# Patient Record
Sex: Female | Born: 1970 | Race: White | Hispanic: No | Marital: Married | Smoking: Never smoker
Health system: Southern US, Community
[De-identification: ages and names within clinical notes are randomized; demographics above are authoritative.]

## PROBLEM LIST (undated history)

## (undated) DIAGNOSIS — D259 Leiomyoma of uterus, unspecified: Secondary | ICD-10-CM

## (undated) DIAGNOSIS — L309 Dermatitis, unspecified: Secondary | ICD-10-CM

## (undated) DIAGNOSIS — T7840XA Allergy, unspecified, initial encounter: Secondary | ICD-10-CM

## (undated) DIAGNOSIS — N390 Urinary tract infection, site not specified: Secondary | ICD-10-CM

## (undated) DIAGNOSIS — IMO0001 Reserved for inherently not codable concepts without codable children: Secondary | ICD-10-CM

## (undated) DIAGNOSIS — E039 Hypothyroidism, unspecified: Secondary | ICD-10-CM

## (undated) DIAGNOSIS — B019 Varicella without complication: Secondary | ICD-10-CM

## (undated) DIAGNOSIS — F419 Anxiety disorder, unspecified: Secondary | ICD-10-CM

## (undated) DIAGNOSIS — N200 Calculus of kidney: Secondary | ICD-10-CM

## (undated) HISTORY — DX: Hypothyroidism, unspecified: E03.9

## (undated) HISTORY — DX: Varicella without complication: B01.9

## (undated) HISTORY — PX: INTRAUTERINE DEVICE (IUD) INSERTION: SHX5877

## (undated) HISTORY — DX: Dermatitis, unspecified: L30.9

## (undated) HISTORY — DX: Reserved for inherently not codable concepts without codable children: IMO0001

## (undated) HISTORY — DX: Leiomyoma of uterus, unspecified: D25.9

## (undated) HISTORY — PX: OTHER SURGICAL HISTORY: SHX169

## (undated) HISTORY — DX: Calculus of kidney: N20.0

## (undated) HISTORY — DX: Anxiety disorder, unspecified: F41.9

## (undated) HISTORY — DX: Allergy, unspecified, initial encounter: T78.40XA

## (undated) HISTORY — DX: Urinary tract infection, site not specified: N39.0

## (undated) HISTORY — PX: MYOMECTOMY: SHX85

---

## 1998-03-28 ENCOUNTER — Other Ambulatory Visit: Admission: RE | Admit: 1998-03-28 | Discharge: 1998-03-28 | Payer: Self-pay | Admitting: Obstetrics and Gynecology

## 1998-10-22 ENCOUNTER — Encounter: Admission: RE | Admit: 1998-10-22 | Discharge: 1998-10-22 | Payer: Self-pay | Admitting: Family Medicine

## 1998-12-04 ENCOUNTER — Encounter: Admission: RE | Admit: 1998-12-04 | Discharge: 1998-12-04 | Payer: Self-pay | Admitting: Family Medicine

## 1998-12-08 ENCOUNTER — Encounter: Admission: RE | Admit: 1998-12-08 | Discharge: 1998-12-08 | Payer: Self-pay | Admitting: Family Medicine

## 1999-01-23 ENCOUNTER — Encounter: Admission: RE | Admit: 1999-01-23 | Discharge: 1999-01-23 | Payer: Self-pay | Admitting: Family Medicine

## 1999-01-25 ENCOUNTER — Emergency Department (HOSPITAL_COMMUNITY): Admission: EM | Admit: 1999-01-25 | Discharge: 1999-01-25 | Payer: Self-pay | Admitting: Emergency Medicine

## 1999-02-01 ENCOUNTER — Emergency Department (HOSPITAL_COMMUNITY): Admission: EM | Admit: 1999-02-01 | Discharge: 1999-02-01 | Payer: Self-pay | Admitting: *Deleted

## 1999-04-24 ENCOUNTER — Other Ambulatory Visit: Admission: RE | Admit: 1999-04-24 | Discharge: 1999-04-24 | Payer: Self-pay | Admitting: Gynecology

## 2000-02-01 ENCOUNTER — Other Ambulatory Visit: Admission: RE | Admit: 2000-02-01 | Discharge: 2000-02-01 | Payer: Self-pay | Admitting: Gynecology

## 2000-05-18 ENCOUNTER — Encounter: Admission: RE | Admit: 2000-05-18 | Discharge: 2000-08-03 | Payer: Self-pay | Admitting: Gynecology

## 2000-08-03 ENCOUNTER — Inpatient Hospital Stay (HOSPITAL_COMMUNITY): Admission: AD | Admit: 2000-08-03 | Discharge: 2000-08-05 | Payer: Self-pay | Admitting: Gynecology

## 2000-09-13 ENCOUNTER — Other Ambulatory Visit: Admission: RE | Admit: 2000-09-13 | Discharge: 2000-09-13 | Payer: Self-pay | Admitting: Gynecology

## 2001-09-15 ENCOUNTER — Other Ambulatory Visit: Admission: RE | Admit: 2001-09-15 | Discharge: 2001-09-15 | Payer: Self-pay | Admitting: Gynecology

## 2002-10-19 ENCOUNTER — Other Ambulatory Visit: Admission: RE | Admit: 2002-10-19 | Discharge: 2002-10-19 | Payer: Self-pay | Admitting: Gynecology

## 2003-10-29 ENCOUNTER — Other Ambulatory Visit: Admission: RE | Admit: 2003-10-29 | Discharge: 2003-10-29 | Payer: Self-pay | Admitting: Gynecology

## 2004-11-12 ENCOUNTER — Other Ambulatory Visit: Admission: RE | Admit: 2004-11-12 | Discharge: 2004-11-12 | Payer: Self-pay | Admitting: Gynecology

## 2005-05-11 ENCOUNTER — Encounter: Admission: RE | Admit: 2005-05-11 | Discharge: 2005-05-11 | Payer: Self-pay | Admitting: Gynecology

## 2006-01-06 ENCOUNTER — Other Ambulatory Visit: Admission: RE | Admit: 2006-01-06 | Discharge: 2006-01-06 | Payer: Self-pay | Admitting: Gynecology

## 2008-03-21 ENCOUNTER — Ambulatory Visit: Payer: Self-pay | Admitting: Gynecology

## 2008-03-21 ENCOUNTER — Other Ambulatory Visit: Admission: RE | Admit: 2008-03-21 | Discharge: 2008-03-21 | Payer: Self-pay | Admitting: Gynecology

## 2008-03-21 ENCOUNTER — Encounter: Payer: Self-pay | Admitting: Gynecology

## 2008-04-23 ENCOUNTER — Ambulatory Visit: Payer: Self-pay | Admitting: Gynecology

## 2008-06-25 ENCOUNTER — Ambulatory Visit: Payer: Self-pay | Admitting: Gynecology

## 2008-07-23 ENCOUNTER — Ambulatory Visit: Payer: Self-pay | Admitting: Gynecology

## 2008-09-16 ENCOUNTER — Ambulatory Visit: Payer: Self-pay | Admitting: Gynecology

## 2010-01-22 ENCOUNTER — Other Ambulatory Visit
Admission: RE | Admit: 2010-01-22 | Discharge: 2010-01-22 | Payer: Self-pay | Source: Home / Self Care | Admitting: Gynecology

## 2010-01-22 ENCOUNTER — Ambulatory Visit
Admission: RE | Admit: 2010-01-22 | Discharge: 2010-01-22 | Payer: Self-pay | Source: Home / Self Care | Attending: Gynecology | Admitting: Gynecology

## 2010-01-22 ENCOUNTER — Other Ambulatory Visit: Payer: Self-pay | Admitting: Gynecology

## 2010-01-27 ENCOUNTER — Encounter
Admission: RE | Admit: 2010-01-27 | Discharge: 2010-01-27 | Payer: Self-pay | Source: Home / Self Care | Attending: Gynecology | Admitting: Gynecology

## 2010-07-24 ENCOUNTER — Ambulatory Visit: Payer: Self-pay | Admitting: Gynecology

## 2010-08-27 ENCOUNTER — Other Ambulatory Visit: Payer: Self-pay | Admitting: *Deleted

## 2010-08-27 ENCOUNTER — Encounter: Payer: Self-pay | Admitting: *Deleted

## 2010-08-27 DIAGNOSIS — Z30431 Encounter for routine checking of intrauterine contraceptive device: Secondary | ICD-10-CM

## 2010-08-27 MED ORDER — LEVONORGESTREL 20 MCG/24HR IU IUD: 1.0000 | INTRAUTERINE_SYSTEM | Freq: Once | INTRAUTERINE | Status: DC

## 2010-08-27 NOTE — Progress Notes (Signed)
  Checked benefits for patient for remove/insert IUD per appointments request.  Patient will have no copay at check in.  Informed Claudia in appointments and she will call the patient to schedule.  Order in pc.

## 2010-08-31 ENCOUNTER — Encounter: Payer: Self-pay | Admitting: Anesthesiology

## 2010-09-04 ENCOUNTER — Encounter: Payer: Self-pay | Admitting: Gynecology

## 2010-09-04 ENCOUNTER — Ambulatory Visit (INDEPENDENT_AMBULATORY_CARE_PROVIDER_SITE_OTHER): Payer: BC Managed Care – PPO | Admitting: Gynecology

## 2010-09-04 VITALS — BP 110/60

## 2010-09-04 DIAGNOSIS — IMO0001 Reserved for inherently not codable concepts without codable children: Secondary | ICD-10-CM

## 2010-09-04 DIAGNOSIS — Z30431 Encounter for routine checking of intrauterine contraceptive device: Secondary | ICD-10-CM

## 2010-09-04 HISTORY — DX: Reserved for inherently not codable concepts without codable children: IMO0001

## 2010-09-04 MED ORDER — LEVONORGESTREL 20 MCG/24HR IU IUD: INTRAUTERINE_SYSTEM | Freq: Once | INTRAUTERINE | Status: DC

## 2010-09-04 NOTE — Progress Notes (Signed)
Patient is a 40 year old gravida 2 para 2 who presented to the office today for replacement of her Mirena IUD that was scheduled to remove today after 5 years. Patient requested to reinsert a new one she has done well without any complications with the previous one. Patient has read the literature information risks benefits and pros and cons were discussed. Consent form signed. Patient fully aware that this form of contraception is 99% effective and is good for 5 years.  Pelvic exam: Bartholin urethra Skene glands: Within normal limits Vagina: No gross lesions on inspection Cervix: No lesions or discharge IUD string barely seen Uterus: Anteverted normal size shape and consistency adnexa No palpable masses or tenderness Rectal exam: Deferred  Procedure note: The vagina was cleansed with Betadine solution. A single-tooth tenaculum was placed on the anterior cervical lip. With the use of a [place] clamp the IUD string was grasped and retrieved shown to the patient and discarded. The uterus was then sounded to 7-1/2 cm. The Mirena IUD was inserted in a sterile fashion without any complications. The single-tooth tenaculum was removed, patient was given Aleve for any cramping. Patient scheduled to return back in one month for followup.

## 2010-10-02 ENCOUNTER — Ambulatory Visit (INDEPENDENT_AMBULATORY_CARE_PROVIDER_SITE_OTHER): Payer: BC Managed Care – PPO | Admitting: Gynecology

## 2010-10-02 ENCOUNTER — Encounter: Payer: Self-pay | Admitting: Gynecology

## 2010-10-02 VITALS — BP 120/78

## 2010-10-02 DIAGNOSIS — E039 Hypothyroidism, unspecified: Secondary | ICD-10-CM | POA: Insufficient documentation

## 2010-10-02 DIAGNOSIS — Z975 Presence of (intrauterine) contraceptive device: Secondary | ICD-10-CM

## 2010-10-02 DIAGNOSIS — IMO0001 Reserved for inherently not codable concepts without codable children: Secondary | ICD-10-CM | POA: Insufficient documentation

## 2010-10-02 DIAGNOSIS — Z30431 Encounter for routine checking of intrauterine contraceptive device: Secondary | ICD-10-CM

## 2010-10-02 NOTE — Patient Instructions (Signed)
Will see you in January for your annual exam remember to be fasting that day for blood work.

## 2010-10-02 NOTE — Progress Notes (Signed)
Patient presented to the office today for followup after having placed a Mirena IUD on 09/04/2010. Patient was asymptomatic.  Examination: Pelvic: And urethra Skene was within normal limits Vagina: No gross lesions or discharge Cervix: IUD string not visualized Uterus: Anteverted normal size shape and consistency Rectal exam: Not done  The colposcope was brought into the room and with the use of the endocervical speculum the IUD string was visualized.  Normal pelvic exam one month post Mirena IUD insertion patient scheduled to return January 2013 for her annual gynecological examination or when necessary.

## 2011-01-18 ENCOUNTER — Ambulatory Visit (INDEPENDENT_AMBULATORY_CARE_PROVIDER_SITE_OTHER): Payer: BC Managed Care – PPO

## 2011-01-18 DIAGNOSIS — R21 Rash and other nonspecific skin eruption: Secondary | ICD-10-CM

## 2011-04-12 ENCOUNTER — Other Ambulatory Visit: Payer: Self-pay | Admitting: Gynecology

## 2011-04-12 ENCOUNTER — Telehealth: Payer: Self-pay | Admitting: *Deleted

## 2011-04-12 DIAGNOSIS — Z1231 Encounter for screening mammogram for malignant neoplasm of breast: Secondary | ICD-10-CM

## 2011-04-12 MED ORDER — LEVOTHYROXINE SODIUM 50 MCG PO TABS: 50.0000 ug | ORAL_TABLET | Freq: Every day | ORAL | Status: DC

## 2011-04-12 NOTE — Telephone Encounter (Signed)
Pt called requesting refill on synthroid 50 mcg. Pt has annual scheduled 04/23/11. rx sent to pharmacy or 30 day supply only.

## 2011-04-14 ENCOUNTER — Ambulatory Visit
Admission: RE | Admit: 2011-04-14 | Discharge: 2011-04-14 | Disposition: A | Discharge status: Home / Self Care | Payer: BC Managed Care – PPO | Source: Ambulatory Visit | Attending: Gynecology | Admitting: Gynecology

## 2011-04-14 DIAGNOSIS — Z1231 Encounter for screening mammogram for malignant neoplasm of breast: Secondary | ICD-10-CM

## 2011-04-23 ENCOUNTER — Ambulatory Visit (INDEPENDENT_AMBULATORY_CARE_PROVIDER_SITE_OTHER): Payer: BC Managed Care – PPO | Admitting: Gynecology

## 2011-04-23 ENCOUNTER — Encounter: Payer: Self-pay | Admitting: Gynecology

## 2011-04-23 VITALS — BP 110/76 | Ht 63.0 in | Wt 135.0 lb

## 2011-04-23 DIAGNOSIS — E039 Hypothyroidism, unspecified: Secondary | ICD-10-CM

## 2011-04-23 DIAGNOSIS — Z01419 Encounter for gynecological examination (general) (routine) without abnormal findings: Secondary | ICD-10-CM

## 2011-04-23 DIAGNOSIS — R634 Abnormal weight loss: Secondary | ICD-10-CM

## 2011-04-23 DIAGNOSIS — L309 Dermatitis, unspecified: Secondary | ICD-10-CM | POA: Insufficient documentation

## 2011-04-23 DIAGNOSIS — Z833 Family history of diabetes mellitus: Secondary | ICD-10-CM

## 2011-04-23 LAB — GLUCOSE, RANDOM: Glucose, Bld: 84 mg/dL (ref 70–99)

## 2011-04-23 LAB — LIPID PANEL
Cholesterol: 150 mg/dL (ref 0–200)
HDL: 47 mg/dL (ref >39)
LDL Cholesterol: 89 mg/dL (ref 0–99)
Total CHOL/HDL Ratio: 3.2 Ratio
Triglycerides: 72 mg/dL (ref <150)
VLDL: 14 mg/dL (ref 0–40)

## 2011-04-23 MED ORDER — LEVOTHYROXINE SODIUM 50 MCG PO TABS: 50.0000 ug | ORAL_TABLET | Freq: Every day | ORAL | Status: DC

## 2011-04-23 NOTE — Patient Instructions (Signed)

## 2011-04-23 NOTE — Progress Notes (Signed)
Julia Wagner 07-25-1970 [id number]   History:    41 y.o.  for annual exam with the only complaint that she has was shaky hands sporadically at different times. She had a Mirena IUD changed and a new one placed the last year and is having normal cycles otherwise. She has been exercising regularly and dilating to the point that she was weighing 177 pounds and is down to 135 now with a BMI of 23.91. She in frequently does her self breast examinations had a normal mammogram this year. She does have a history of hypothyroidism currently on levothyroxine 50 mcg daily.  Past medical history,surgical history, family history and social history were all reviewed and documented in the EPIC chart.  Gynecologic History Patient's last menstrual period was 03/19/2011. Contraception: IUD Last Pap: 2012. Results were: normal Last mammogram: 2013. Results were: normal  Obstetric History OB History    Grav Para Term Preterm Abortions TAB SAB Ect Mult Living   2 2        2      # Outc Date GA Lbr Len/2nd Wgt Sex Del Anes PTL Lv   1 PAR            2 PAR                ROS:  Was performed and pertinent positives and negatives are included in the history.  Exam: chaperone present  BP 110/76  Ht 5\' 3"  (1.6 m)  Wt 135 lb (61.236 kg)  BMI 23.91 kg/m2  LMP 03/19/2011  Body mass index is 23.91 kg/(m^2).  General appearance : Well developed well nourished female. No acute distress HEENT: Neck supple, trachea midline, no carotid bruits, no thyroidmegaly Lungs: Clear to auscultation, no rhonchi or wheezes, or rib retractions  Heart: Regular rate and rhythm, no murmurs or gallops Breast:Examined in sitting and supine position were symmetrical in appearance, no palpable masses or tenderness,  no skin retraction, no nipple inversion, no nipple discharge, no skin discoloration, no axillary or supraclavicular lymphadenopathy Abdomen: no palpable masses or tenderness, no rebound or guarding Extremities: no  edema or skin discoloration or tenderness  Pelvic:  Bartholin, Urethra, Skene Glands: Within normal limits, IUD string seen             Vagina: No gross lesions or discharge  Cervix: No gross lesions or discharge  Uterus  anteverted, normal size, shape and consistency, non-tender and mobile  Adnexa  Without masses or tenderness  Anus and perineum  normal   Rectovaginal  normal sphincter tone without palpated masses or tenderness             Hemoccult not done     Assessment/Plan:  41 y.o. female for annual exam with history of hypothyroidism on 50 mcg of levothyroxine. We'll check a TSH today along with a fasting blood sugar fasting lipid profile CBC and urinalysis. Literature information was provided on self breast examination. New Pap smear screening guidelines discussed. She's 89 a Pap smear for 2 more years. She will be referred to a neurologist for further evaluation of her shaky hands.    Ok Edwards MD, 8:58 AM 04/23/2011

## 2011-04-24 LAB — CBC WITH DIFFERENTIAL/PLATELET
Basophils Absolute: 0.1 10*3/uL (ref 0.0–0.1)
Basophils Relative: 1 % (ref 0–1)
Eosinophils Absolute: 0.3 10*3/uL (ref 0.0–0.7)
Eosinophils Relative: 4 % (ref 0–5)
HCT: 40.6 % (ref 36.0–46.0)
Hemoglobin: 13.2 g/dL (ref 12.0–15.0)
Lymphocytes Relative: 37 % (ref 12–46)
Lymphs Abs: 2.5 10*3/uL (ref 0.7–4.0)
MCH: 30.8 pg (ref 26.0–34.0)
MCHC: 32.5 g/dL (ref 30.0–36.0)
MCV: 94.9 fL (ref 78.0–100.0)
Monocytes Absolute: 0.5 10*3/uL (ref 0.1–1.0)
Monocytes Relative: 8 % (ref 3–12)
Neutro Abs: 3.4 10*3/uL (ref 1.7–7.7)
Neutrophils Relative %: 51 % (ref 43–77)
Platelets: 278 10*3/uL (ref 150–400)
RBC: 4.28 MIL/uL (ref 3.87–5.11)
RDW: 13.2 % (ref 11.5–15.5)
WBC: 6.7 10*3/uL (ref 4.0–10.5)

## 2011-04-24 LAB — URINALYSIS W MICROSCOPIC + REFLEX CULTURE
Bilirubin Urine: NEGATIVE
Casts: NONE SEEN
Crystals: NONE SEEN
Glucose, UA: NEGATIVE mg/dL
Ketones, ur: NEGATIVE mg/dL
Nitrite: NEGATIVE
Protein, ur: NEGATIVE mg/dL
Specific Gravity, Urine: 1.014 (ref 1.005–1.030)
Urobilinogen, UA: 0.2 mg/dL (ref 0.0–1.0)
pH: 7 (ref 5.0–8.0)

## 2011-04-24 LAB — TSH: TSH: 4.313 u[IU]/mL (ref 0.350–4.500)

## 2011-04-27 ENCOUNTER — Other Ambulatory Visit: Payer: Self-pay | Admitting: Gynecology

## 2011-04-27 LAB — URINE CULTURE: Colony Count: 70000

## 2011-04-27 MED ORDER — NITROFURANTOIN MONOHYD MACRO 100 MG PO CAPS: 100.0000 mg | ORAL_CAPSULE | Freq: Two times a day (BID) | ORAL | Status: AC

## 2011-05-12 ENCOUNTER — Other Ambulatory Visit: Payer: Self-pay | Admitting: *Deleted

## 2011-05-12 DIAGNOSIS — E039 Hypothyroidism, unspecified: Secondary | ICD-10-CM

## 2011-05-12 MED ORDER — LEVOTHYROXINE SODIUM 50 MCG PO TABS: 50.0000 ug | ORAL_TABLET | Freq: Every day | ORAL | Status: DC

## 2011-10-26 ENCOUNTER — Ambulatory Visit (INDEPENDENT_AMBULATORY_CARE_PROVIDER_SITE_OTHER): Payer: BC Managed Care – PPO | Admitting: Internal Medicine

## 2011-10-26 ENCOUNTER — Ambulatory Visit: Payer: BC Managed Care – PPO

## 2011-10-26 VITALS — BP 105/70 | HR 76 | Temp 98.1°F | Resp 18 | Wt 136.0 lb

## 2011-10-26 DIAGNOSIS — M542 Cervicalgia: Secondary | ICD-10-CM

## 2011-10-26 NOTE — Progress Notes (Deleted)
  Subjective:    Patient ID: Julia Wagner, female    DOB: Jun 30, 1970, 41 y.o.   MRN: [id number]  HPI    Review of Systems     Objective:   Physical Exam        Assessment & Plan:
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
## 2011-11-02 ENCOUNTER — Ambulatory Visit (INDEPENDENT_AMBULATORY_CARE_PROVIDER_SITE_OTHER): Payer: BC Managed Care – PPO | Admitting: Physician Assistant

## 2011-11-02 ENCOUNTER — Telehealth: Payer: Self-pay

## 2011-11-02 VITALS — BP 117/79 | HR 64 | Temp 98.0°F | Resp 16 | Ht 63.0 in | Wt 137.2 lb

## 2011-11-02 DIAGNOSIS — M542 Cervicalgia: Secondary | ICD-10-CM

## 2011-11-02 DIAGNOSIS — M62838 Other muscle spasm: Secondary | ICD-10-CM

## 2011-11-02 MED ORDER — DICLOFENAC SODIUM 75 MG PO TBEC: 75.0000 mg | DELAYED_RELEASE_TABLET | Freq: Two times a day (BID) | ORAL | Status: DC

## 2011-11-02 MED ORDER — CYCLOBENZAPRINE HCL 5 MG PO TABS: 5.0000 mg | ORAL_TABLET | Freq: Three times a day (TID) | ORAL | Status: DC | PRN

## 2011-11-02 NOTE — Telephone Encounter (Signed)
Spoke with pt and obtained more information: states her neck still feels tired from holding head up.  Right side are and upper back has sharp  Pain.  Right axillary area comes and goes (sharp pain).  Now is experiencing left side jaw tenderness, and noticed last night light bruise on right side of back.  She has been taking aleve and resting as instructed.  Wants to know what she needs to do, come in? PT? Please advise.

## 2011-11-02 NOTE — Telephone Encounter (Signed)
Patient is still having issues and was wondering if she needs to come in or if she should be referred to a PT.  Best [phone]

## 2011-11-02 NOTE — Telephone Encounter (Signed)
Best number is [phone], but after 5 can call cell [phone]

## 2011-11-02 NOTE — Telephone Encounter (Signed)
Called pt to advise
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
## 2011-11-02 NOTE — Telephone Encounter (Signed)
I think she may need to RTC for further evaluation, especially since she is having new symptoms.

## 2011-11-10 ENCOUNTER — Ambulatory Visit: Payer: BC Managed Care – PPO | Attending: Physician Assistant

## 2011-11-10 DIAGNOSIS — M546 Pain in thoracic spine: Secondary | ICD-10-CM | POA: Insufficient documentation

## 2011-11-10 DIAGNOSIS — R5381 Other malaise: Secondary | ICD-10-CM | POA: Insufficient documentation

## 2011-11-10 DIAGNOSIS — M542 Cervicalgia: Secondary | ICD-10-CM | POA: Insufficient documentation

## 2011-11-10 DIAGNOSIS — IMO0001 Reserved for inherently not codable concepts without codable children: Secondary | ICD-10-CM | POA: Insufficient documentation

## 2011-11-12 ENCOUNTER — Ambulatory Visit: Payer: BC Managed Care – PPO | Admitting: Physical Therapy

## 2011-11-16 ENCOUNTER — Ambulatory Visit: Payer: BC Managed Care – PPO | Admitting: Physical Therapy

## 2011-11-22 ENCOUNTER — Ambulatory Visit: Payer: BC Managed Care – PPO

## 2011-11-24 ENCOUNTER — Ambulatory Visit: Payer: BC Managed Care – PPO | Admitting: Physical Therapy

## 2011-11-29 ENCOUNTER — Ambulatory Visit: Payer: BC Managed Care – PPO

## 2011-12-01 ENCOUNTER — Telehealth: Payer: Self-pay | Admitting: *Deleted

## 2011-12-01 NOTE — Telephone Encounter (Signed)
Pt called c/o possible UTI, requesting medication. I told pt OV best, pt said she has a hockey game this evening and will call back later. I told pt we are closed for thanksgiving holiday on thurs & fri and urgent would be able to help with treatment if needed. Pt said okay.

## 2011-12-06 ENCOUNTER — Ambulatory Visit: Payer: BC Managed Care – PPO | Attending: Physician Assistant | Admitting: Physical Therapy

## 2011-12-06 DIAGNOSIS — M542 Cervicalgia: Secondary | ICD-10-CM | POA: Diagnosis not present

## 2011-12-06 DIAGNOSIS — IMO0001 Reserved for inherently not codable concepts without codable children: Secondary | ICD-10-CM | POA: Insufficient documentation

## 2011-12-06 DIAGNOSIS — M546 Pain in thoracic spine: Secondary | ICD-10-CM | POA: Insufficient documentation

## 2011-12-06 DIAGNOSIS — R5381 Other malaise: Secondary | ICD-10-CM | POA: Insufficient documentation

## 2011-12-08 ENCOUNTER — Ambulatory Visit: Payer: BC Managed Care – PPO

## 2011-12-08 ENCOUNTER — Ambulatory Visit: Payer: BC Managed Care – PPO | Admitting: Physical Therapy

## 2011-12-13 ENCOUNTER — Encounter: Payer: BC Managed Care – PPO | Admitting: Physical Therapy

## 2011-12-15 ENCOUNTER — Encounter: Payer: BC Managed Care – PPO | Admitting: Physical Therapy

## 2012-04-05 ENCOUNTER — Ambulatory Visit (INDEPENDENT_AMBULATORY_CARE_PROVIDER_SITE_OTHER): Payer: BC Managed Care – PPO | Admitting: Gynecology

## 2012-04-05 ENCOUNTER — Encounter: Payer: Self-pay | Admitting: Gynecology

## 2012-04-05 VITALS — BP 110/70

## 2012-04-05 DIAGNOSIS — R350 Frequency of micturition: Secondary | ICD-10-CM

## 2012-04-05 DIAGNOSIS — N938 Other specified abnormal uterine and vaginal bleeding: Secondary | ICD-10-CM

## 2012-04-05 DIAGNOSIS — M545 Low back pain: Secondary | ICD-10-CM

## 2012-04-05 DIAGNOSIS — N949 Unspecified condition associated with female genital organs and menstrual cycle: Secondary | ICD-10-CM

## 2012-04-05 DIAGNOSIS — J069 Acute upper respiratory infection, unspecified: Secondary | ICD-10-CM

## 2012-04-05 DIAGNOSIS — IMO0001 Reserved for inherently not codable concepts without codable children: Secondary | ICD-10-CM

## 2012-04-05 DIAGNOSIS — M549 Dorsalgia, unspecified: Secondary | ICD-10-CM

## 2012-04-05 DIAGNOSIS — B373 Candidiasis of vulva and vagina: Secondary | ICD-10-CM

## 2012-04-05 DIAGNOSIS — N898 Other specified noninflammatory disorders of vagina: Secondary | ICD-10-CM

## 2012-04-05 LAB — URINALYSIS W MICROSCOPIC + REFLEX CULTURE
Bilirubin Urine: NEGATIVE
Casts: NONE SEEN
Crystals: NONE SEEN
Glucose, UA: NEGATIVE mg/dL
Ketones, ur: NEGATIVE mg/dL
Nitrite: NEGATIVE
Protein, ur: NEGATIVE mg/dL
Specific Gravity, Urine: 1.01 (ref 1.005–1.030)
Urobilinogen, UA: 0.2 mg/dL (ref 0.0–1.0)
pH: 5.5 (ref 5.0–8.0)

## 2012-04-05 LAB — WET PREP FOR TRICH, YEAST, CLUE
Clue Cells Wet Prep HPF POC: NONE SEEN
Trich, Wet Prep: NONE SEEN

## 2012-04-05 MED ORDER — FLUCONAZOLE 100 MG PO TABS: ORAL_TABLET | ORAL | Status: DC

## 2012-04-05 NOTE — Patient Instructions (Signed)
Vaginitis Vaginitis in a soreness, swelling and redness (inflammation) of the vagina and vulva. This is not a sexually transmitted infection.  CAUSES  Yeast vaginitis is caused by yeast (candida) that is normally found in your vagina. With a yeast infection, the candida has over grown in number to a point that upsets the chemical balance. SYMPTOMS   White thick vaginal discharge.   Swelling, itching, redness and irritation of the vagina and possibly the lips of the vagina (vulva).   Burning or painful urination.   Painful intercourse.  HOME CARE INSTRUCTIONS   Finish all medication as prescribed.   Do not have sex until treatment is completed or instructed by your healthcare giver.   Take warm sitz baths.   Do not douche.   Do not use tampons, especially scented ones.   Wear cotton underwear.   Avoid tight pants and panty hose.   Tell your sexual partner that you have a yeast infection. They should go to their caregiver if they have symptoms such as mild rash or itching.   Your sexual partner should be treated if your infection is difficult to eliminate.   Practice safer sex. Use condoms.   Some vaginal medications cause latex condoms to fail. Ask your caregiver this.  SEEK MEDICAL CARE IF:   You develop a fever.   The infection is getting worse after 2 days of treatment.   The infection is not getting better after 3 days of treatment.   You develop blisters in or around your vagina.   You develop vaginal bleeding, and it is not your menstrual period.   You have pain when you urinate.   You develop intestinal problems.   You have pain with sexual intercourse.  Document Released: 01/29/2004 Document Revised: 12/10/2010 Document Reviewed: 09/05/2008 [facility] Patient Information 2012 [place], [place].  Transvaginal Ultrasound Transvaginal ultrasound is a pelvic ultrasound, using a metal probe that is placed in the vagina, to look at a women's female organs.  Transvaginal ultrasound is a method of seeing inside the pelvis of a woman. The ultrasound machine sends out sound waves from the transducer (probe). These sound waves bounce off body structures (like an echo) to create a picture. The picture shows up on a monitor. It is called transvaginal because the probe is inserted into the vagina. There should be very little discomfort from the vaginal probe. This test can also be used during pregnancy. Endovaginal ultrasound is another name for a transvaginal ultrasound. In a transabdominal ultrasound, the probe is placed on the outside of the belly. This method gives pictures that are lower quality than pictures from the transvaginal technique. Transvaginal ultrasound is used to look for problems of the female genital tract. Some such problems include:  Infertility problems.  Congenital (birth defect) malformations of the uterus and ovaries.  Tumors in the uterus.  Abnormal bleeding.  Ovarian tumors and cysts.  Abscess (inflamed tissue around pus) in the pelvis.  Unexplained abdominal or pelvic pain.  Pelvic infection. DURING PREGNANCY, TRANSVAGINAL ULTRASOUND MAY BE USED TO LOOK AT:  Normal pregnancy.  Ectopic pregnancy (pregnancy outside the uterus).  Fetal heartbeat.  Abnormalities in the pelvis, that are not seen well with transabdominal ultrasound.  Suspected twins or multiples.  Impending miscarriage.  Problems with the cervix (incompetent cervix, not able to stay closed and hold the baby).  When doing an amniocentesis (removing fluid from the pregnancy sac, for testing).  Looking for abnormalities of the baby.  Checking the growth, development, and age  of the fetus.  Measuring the amount of fluid in the amniotic sac.  When doing an external version of the baby (moving baby into correct position).  Evaluating the baby for problems in high risk pregnancies (biophysical profile).  Suspected fetal demise (death). Sometimes a  special ultrasound method called Saline Infusion Sonography (SIS) is used for a more accurate look at the uterus. Sterile saline (salt water) is injected into the uterus of non-pregnant patients to see the inside of the uterus better. SIS is not used on pregnant women. The vaginal probe can also assist in obtaining biopsies of abnormal areas, in draining fluid from cysts on the ovary, and in finding IUDs (intrauterine device, birth control) that cannot be located. PREPARATION FOR TEST A transvaginal ultrasound is done with the bladder empty. The transabdominal ultrasound is done with your bladder full. You may be asked to drink several glasses of water before that exam. Sometimes, a transabdominal ultrasound is done just after a transvaginal ultrasound, to look at organs in your abdomen. PROCEDURE  You will lie down on a table, with your knees bent and your feet in foot holders. The probe is covered with a condom. A sterile lubricant is put into the vagina and on the probe. The lubricant helps transmit the sound waves and avoid irritating the vagina. Your caregiver will move the probe inside the vaginal cavity to scan the pelvic structures. A normal test will show a normal pelvis and normal contents. An abnormal test will show abnormalities of the pelvis, placenta, or baby. ABNORMAL RESULTS MAY BE DUE TO:  Growths or tumors in the:  Uterus.  Ovaries.  Vagina.  Other pelvic structures.  Non-cancerous growths of the uterus and ovaries.  Twisting of the ovary, cutting off blood supply to the ovary (ovarian torsion).  Areas of infection, including:  Pelvic inflammatory disease.  Abscess in the pelvis.  Locating an IUD. PROBLEMS FOUND IN PREGNANT WOMEN MAY INCLUDE:  Ectopic pregnancy (pregnancy outside the uterus).  Multiple pregnancies.  Early dilation (opening) of the cervix. This may indicate an incompetent cervix and early delivery.  Impending miscarriage.  Fetal  death.  Problems with the placenta, including:  Placenta has grown over the opening of the womb (placenta previa).  Placenta has separated early in the womb (placental abruption).  Placenta grows into the muscle of the uterus (placenta accreta).  Tumors of pregnancy, including gestational trophoblastic disease. This is an abnormal pregnancy, with no fetus. The uterus is filled with many grape-like cysts that could sometimes be cancerous.  Incorrect position of the fetus (breech, vertex).  Intrauterine fetal growth retardation (IUGR) (poor growth in the womb).  Fetal abnormalities or infection. RISKS AND COMPLICATIONS There are no known risks to the ultrasound procedure. There is no X-ray used when doing an ultrasound. Document Released: 12/03/2003 Document Revised: 03/15/2011 Document Reviewed: 11/20/2008 [facility] Patient Information 2013 [place], [place].

## 2012-04-05 NOTE — Progress Notes (Signed)
Patient presented to the office today with several complaints. One other concerns she was having low back discomfort which started a week ago. She was having some frequency but no dysuria but she attributes to her increase fluid intake. She denied any fever chills nausea vomiting but was complaining of a slight vaginal discharge. She stated time she felt some pressure when voiding. She stated she had some amoxicillin at home and she took 250 mg twice a day for 2 days and has 2 tablets left. She had taking it because she thought she had some sinus infection but feels better today. She also brought to my attention that since the end of last year she is having breakthrough bleeding with a Mirena IUD.  Exam: Lungs clear to auscultation Rogers or wheezes Heart: Regular rate and rhythm no murmurs or gallops Oropharyngeal no lesions Otoscopic exam no lesions Neck no submandibular lymphadenopathy No frontal or maxillary tender sinuses Back exam: No CVA tenderness slight para spinous tenderness on the lower lumbar region (left) Abdomen: Soft nontender no rebound or guarding Pelvic: Bartholin urethra Skene was within normal limits Vagina: White discharge was noted Cervix: IUD string not seen Uterus: Anteverted upper limits of normal no palpable masses or tenderness Adnexa: No palpable masses or tenderness Rectal exam: Not done  Urinalysis: 3-6 WBC, few bacteria  Wet prep moderate yeast  Assessment/plan: #1 vulvovaginitis will be treated with Diflucan 100 mg one by mouth today. She will finish the last 2 tablets of amoxicillin that she has. We'll check a urine culture and wait for the results. She will return back to the office next week for an ultrasound to confirm that the IUD still in the right place. We'll discuss then managing her breakthrough bleeding on the IUD after the ultrasound. Her back discomfort as far in between and baby from exercising she can take nonsteroidal when necessary

## 2012-04-07 LAB — URINE CULTURE
Colony Count: NO GROWTH
Organism ID, Bacteria: NO GROWTH

## 2012-04-14 ENCOUNTER — Ambulatory Visit (INDEPENDENT_AMBULATORY_CARE_PROVIDER_SITE_OTHER): Payer: BC Managed Care – PPO

## 2012-04-14 ENCOUNTER — Encounter: Payer: Self-pay | Admitting: Gynecology

## 2012-04-14 ENCOUNTER — Ambulatory Visit (INDEPENDENT_AMBULATORY_CARE_PROVIDER_SITE_OTHER): Payer: BC Managed Care – PPO | Admitting: Gynecology

## 2012-04-14 DIAGNOSIS — N938 Other specified abnormal uterine and vaginal bleeding: Secondary | ICD-10-CM

## 2012-04-14 DIAGNOSIS — N83209 Unspecified ovarian cyst, unspecified side: Secondary | ICD-10-CM

## 2012-04-14 DIAGNOSIS — D250 Submucous leiomyoma of uterus: Secondary | ICD-10-CM | POA: Insufficient documentation

## 2012-04-14 DIAGNOSIS — N949 Unspecified condition associated with female genital organs and menstrual cycle: Secondary | ICD-10-CM

## 2012-04-14 DIAGNOSIS — M549 Dorsalgia, unspecified: Secondary | ICD-10-CM

## 2012-04-14 DIAGNOSIS — N83202 Unspecified ovarian cyst, left side: Secondary | ICD-10-CM

## 2012-04-14 MED ORDER — MEGESTROL ACETATE 40 MG PO TABS: 40.0000 mg | ORAL_TABLET | Freq: Two times a day (BID) | ORAL | Status: DC

## 2012-04-14 NOTE — Progress Notes (Signed)
Patient presented to the office today for an ultrasound due to the fact that she been complaining of low back discomfort and regular bleeding on the Mirena IUD since the end of last year. She was seen in the office on 04/05/2012 see previous note for additional details.  Ultrasound today: Uterus 10.6 x 6.2 x 4.6 and meters with endometrial stripe of 5.1 mm. An endometrial fibroid measuring 3.3 x 2.5 x 2.9 cm was seen. The IUD was seen transversely on top of the fibroid in the fundus. Right ovary was normal. A left ovarian echo-free thinwall avascular cyst measuring 3.5 x 3.4 x 2.3 cm was noted. No free fluid in the cul-de-sac.  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 She will make an appointment to return back to the office in one to 2 weeks for sonohysterogram to better delineate the submucous myoma and plan at a later date for resectoscopic myomectomy as an outpatient procedure.  The left ovarian cyst appears to be benign and we'll continue to follow Paps with another scan 3-4 months from now.

## 2012-04-14 NOTE — Patient Instructions (Addendum)
Fibroids Fibroids are lumps (tumors) that can occur any place in a woman's body. These lumps are not cancerous. Fibroids vary in size, weight, and where they grow. HOME CARE  Do not take aspirin.  Write down the number of pads or tampons you use during your period. Tell your doctor. This can help determine the best treatment for you. GET HELP RIGHT AWAY IF:  You have pain in your lower belly (abdomen) that is not helped with medicine.  You have cramps that are not helped with medicine.  You have more bleeding between or during your period.  You feel lightheaded or pass out (faint).  Your lower belly pain gets worse. MAKE SURE YOU:  Understand these instructions.  Will watch your condition.  Will get help right away if you are not doing well or get worse. Document Released: 01/23/2010 Document Revised: 03/15/2011 Document Reviewed: 01/23/2010 [employer] Patient Information 2013 [employer].  

## 2012-05-01 ENCOUNTER — Telehealth: Payer: Self-pay | Admitting: *Deleted

## 2012-05-01 NOTE — Telephone Encounter (Signed)
Pt is scheduled for [facility] on 05/03/12 pt asked if okay to take megace 40 mg given on OV 04/14/12 when bleeding should occur, I informed pt this medication will help stop bleeding. She will refill do to light spotting.

## 2012-05-03 ENCOUNTER — Ambulatory Visit (INDEPENDENT_AMBULATORY_CARE_PROVIDER_SITE_OTHER): Payer: BC Managed Care – PPO

## 2012-05-03 ENCOUNTER — Ambulatory Visit (INDEPENDENT_AMBULATORY_CARE_PROVIDER_SITE_OTHER): Payer: BC Managed Care – PPO | Admitting: Gynecology

## 2012-05-03 DIAGNOSIS — Z975 Presence of (intrauterine) contraceptive device: Secondary | ICD-10-CM

## 2012-05-03 DIAGNOSIS — D259 Leiomyoma of uterus, unspecified: Secondary | ICD-10-CM

## 2012-05-03 DIAGNOSIS — N938 Other specified abnormal uterine and vaginal bleeding: Secondary | ICD-10-CM

## 2012-05-03 DIAGNOSIS — D250 Submucous leiomyoma of uterus: Secondary | ICD-10-CM

## 2012-05-03 DIAGNOSIS — N949 Unspecified condition associated with female genital organs and menstrual cycle: Secondary | ICD-10-CM

## 2012-05-03 DIAGNOSIS — N921 Excessive and frequent menstruation with irregular cycle: Secondary | ICD-10-CM

## 2012-05-03 LAB — CBC WITH DIFFERENTIAL/PLATELET
Basophils Absolute: 0.1 10*3/uL (ref 0.0–0.1)
Basophils Relative: 1 % (ref 0–1)
Eosinophils Absolute: 0.1 10*3/uL (ref 0.0–0.7)
Eosinophils Relative: 2 % (ref 0–5)
HCT: 36.7 % (ref 36.0–46.0)
Hemoglobin: 12.8 g/dL (ref 12.0–15.0)
Lymphocytes Relative: 28 % (ref 12–46)
Lymphs Abs: 2.2 10*3/uL (ref 0.7–4.0)
MCH: 31 pg (ref 26.0–34.0)
MCHC: 34.9 g/dL (ref 30.0–36.0)
MCV: 88.9 fL (ref 78.0–100.0)
Monocytes Absolute: 0.5 10*3/uL (ref 0.1–1.0)
Monocytes Relative: 6 % (ref 3–12)
Neutro Abs: 5 10*3/uL (ref 1.7–7.7)
Neutrophils Relative %: 63 % (ref 43–77)
Platelets: 255 10*3/uL (ref 150–400)
RBC: 4.13 MIL/uL (ref 3.87–5.11)
RDW: 13.5 % (ref 11.5–15.5)
WBC: 7.8 10*3/uL (ref 4.0–10.5)

## 2012-05-03 NOTE — Progress Notes (Signed)
Patient presented to the office today for further evaluation of her dysfunctional uterine bleeding. 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 She had an ultrasound 04/14/2012 with the following result:  Ultrasound today:  Uterus 10.6 x 6.2 x 4.6 and meters with endometrial stripe of 5.1 mm. An endometrial fibroid measuring 3.3 x 2.5 x 2.9 cm was seen. The IUD was seen transversely on top of the fibroid in the fundus. Right ovary was normal. A left ovarian echo-free thinwall avascular cyst measuring 3.5 x 3.4 x 2.3 cm was noted. No free fluid in the cul-de-sac.  Her sonohysterogram demonstrated today that she had a solid focus measuring 2.2 x 3.0 x 2.6 mm in the endometrial cavity positive color flow and a feeder vessel was noted. The IUD was seen posterior and lateral to the fibroid. Both ovaries were normal. Sonohysterogram demonstrated a submucous fibroid measured 3.3 x 3.4 x 2.8 cm. Myometrial thickness 12 mm for the wall the fibroid.  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 IUD in place. We discussed proceeding with a resectoscopic myomectomy. Patient will be placed on Lupron 11.25 mg IM for 3 months and there return to the office for sonohysterogram to see the fibroid has decreased in size and then schedule resectoscopic myomectomy. We will check her CBC today. She is currently on Megace 40 mg twice a day to contain her breakthrough bleeding until she received Lupron. The risks benefits and pros and cons of medication were discussed. And literature information was provided.

## 2012-05-05 ENCOUNTER — Encounter: Payer: BC Managed Care – PPO | Admitting: Gynecology

## 2012-05-17 ENCOUNTER — Telehealth: Payer: Self-pay | Admitting: *Deleted

## 2012-05-17 ENCOUNTER — Ambulatory Visit (INDEPENDENT_AMBULATORY_CARE_PROVIDER_SITE_OTHER): Payer: BC Managed Care – PPO | Admitting: Anesthesiology

## 2012-05-17 DIAGNOSIS — D259 Leiomyoma of uterus, unspecified: Secondary | ICD-10-CM

## 2012-05-17 MED ORDER — LEUPROLIDE ACETATE (3 MONTH) 11.25 MG IM KIT
11.2500 mg | PACK | Freq: Once | INTRAMUSCULAR | Status: AC
  Administered 2012-05-17: 11.25 mg via INTRAMUSCULAR

## 2012-05-17 NOTE — Telephone Encounter (Signed)
Lupron arrived and will come today at 1130 for Lupron shot. KW

## 2012-05-19 ENCOUNTER — Other Ambulatory Visit: Payer: Self-pay | Admitting: Gynecology

## 2012-05-19 DIAGNOSIS — N920 Excessive and frequent menstruation with regular cycle: Secondary | ICD-10-CM

## 2012-05-31 ENCOUNTER — Telehealth: Payer: Self-pay | Admitting: *Deleted

## 2012-05-31 DIAGNOSIS — E039 Hypothyroidism, unspecified: Secondary | ICD-10-CM

## 2012-05-31 NOTE — Telephone Encounter (Signed)
Please call and Megace 40 mg twice a day for 10 days to help with her bleeding until she gets her Lupron shot on the 14th. Please call and Synthroid 50 mcg to take 1 by mouth daily #30 with 5 refills

## 2012-05-31 NOTE — Telephone Encounter (Signed)
Pt called just to follow up regarding receiving Lupron on 05/17/12 due to fibroids. Pt is still having some bleeding, not heavy. She is also requesting a refill on synthroid 50 mcg. annual was due in April, pt asked if you want to wait unit after her [facility] appt which is in august. to have annual?

## 2012-06-01 MED ORDER — MEGESTROL ACETATE 40 MG PO TABS: 40.0000 mg | ORAL_TABLET | Freq: Two times a day (BID) | ORAL | Status: DC

## 2012-06-01 MED ORDER — LEVOTHYROXINE SODIUM 50 MCG PO TABS: 50.0000 ug | ORAL_TABLET | Freq: Every day | ORAL | Status: DC

## 2012-06-01 NOTE — Telephone Encounter (Signed)
Pt informed with the below note, both rx sent.

## 2012-06-01 NOTE — Telephone Encounter (Signed)
Left message for pt to call.

## 2012-07-10 ENCOUNTER — Other Ambulatory Visit: Payer: Self-pay

## 2012-07-10 DIAGNOSIS — Z1231 Encounter for screening mammogram for malignant neoplasm of breast: Secondary | ICD-10-CM

## 2012-08-03 ENCOUNTER — Ambulatory Visit
Admission: RE | Admit: 2012-08-03 | Discharge: 2012-08-03 | Disposition: A | Discharge status: Home / Self Care | Payer: BC Managed Care – PPO | Source: Ambulatory Visit

## 2012-08-03 DIAGNOSIS — Z1231 Encounter for screening mammogram for malignant neoplasm of breast: Secondary | ICD-10-CM

## 2012-08-11 ENCOUNTER — Ambulatory Visit (INDEPENDENT_AMBULATORY_CARE_PROVIDER_SITE_OTHER): Payer: Managed Care, Other (non HMO)

## 2012-08-11 ENCOUNTER — Other Ambulatory Visit: Payer: Self-pay | Admitting: Gynecology

## 2012-08-11 ENCOUNTER — Ambulatory Visit (INDEPENDENT_AMBULATORY_CARE_PROVIDER_SITE_OTHER): Payer: Managed Care, Other (non HMO) | Admitting: Gynecology

## 2012-08-11 DIAGNOSIS — D250 Submucous leiomyoma of uterus: Secondary | ICD-10-CM

## 2012-08-11 DIAGNOSIS — N920 Excessive and frequent menstruation with regular cycle: Secondary | ICD-10-CM

## 2012-08-11 DIAGNOSIS — D259 Leiomyoma of uterus, unspecified: Secondary | ICD-10-CM

## 2012-08-11 DIAGNOSIS — N949 Unspecified condition associated with female genital organs and menstrual cycle: Secondary | ICD-10-CM

## 2012-08-11 DIAGNOSIS — N938 Other specified abnormal uterine and vaginal bleeding: Secondary | ICD-10-CM

## 2012-08-11 NOTE — Patient Instructions (Signed)
Hysteroscopy Hysteroscopy is a procedure used for looking inside the womb (uterus). It may be done for many different reasons, including:  To evaluate abnormal bleeding, fibroid (benign, noncancerous) tumors, polyps, scar tissue (adhesions), and possibly cancer of the uterus.  To look for lumps (tumors) and other uterine growths.  To look for causes of why a woman cannot get pregnant (infertility), causes of recurrent loss of pregnancy (miscarriages), or a lost intrauterine device (IUD).  To perform a sterilization by blocking the fallopian tubes from inside the uterus. A hysteroscopy should be done right after a menstrual period to be sure you are not pregnant. LET YOUR CAREGIVER KNOW ABOUT:   Allergies.  Medicines taken, including herbs, eyedrops, over-the-counter medicines, and creams.  Use of steroids (by mouth or creams).  Previous problems with anesthetics or numbing medicines.  History of bleeding or blood problems.  History of blood clots.  Possibility of pregnancy, if this applies.  Previous surgery.  Other health problems. RISKS AND COMPLICATIONS   Putting a hole in the uterus.  Excessive bleeding.  Infection.  Damage to the cervix.  Injury to other organs.  Allergic reaction to medicines.  Too much fluid used in the uterus for the procedure. BEFORE THE PROCEDURE   Do not take aspirin or blood thinners for a week before the procedure, or as directed. It can cause bleeding.  Arrive at least 60 minutes before the procedure or as directed to read and sign the necessary forms.  Arrange for someone to take you home after the procedure.  If you smoke, do not smoke for 2 weeks before the procedure. PROCEDURE   Your caregiver may give you medicine to relax you. He or she may also give you a medicine that numbs the area around the cervix (local anesthetic) or a medicine that makes you sleep (general anesthesia).  Sometimes, a medicine is placed in the cervix  the day before the procedure. This medicine makes the cervix have a larger opening (dilate). This makes it easier for the instrument to be inserted into the uterus.  A small instrument (hysteroscope) is inserted through the vagina into the uterus. This instrument is similar to a pencil-sized telescope with a light.  During the procedure, air or a liquid is put into the uterus, which allows the surgeon to see better.  Sometimes, tissue is gently scraped from inside the uterus. These tissue samples are sent to a specialist who looks at tissue samples (pathologist). The pathologist will give a report to your caregiver. This will help your caregiver decide if further treatment is necessary. The report will also help your caregiver decide on the best treatment if the test comes back abnormal. AFTER THE PROCEDURE   If you had a general anesthetic, you may be groggy for a couple hours after the procedure.  If you had a local anesthetic, you will be advised to rest at the surgical center or caregiver's office until you are stable and feel ready to go home.  You may have some cramping for a couple days.  You may have bleeding, which varies from light spotting for a few days to menstrual-like bleeding for up to 3 to 7 days. This is normal.  Have someone take you home. FINDING OUT THE RESULTS OF YOUR TEST Not all test results are available during your visit. If your test results are not back during the visit, make an appointment with your caregiver to find out the results. Do not assume everything is normal if you   have not heard from your caregiver or the medical facility. It is important for you to follow up on all of your test results. HOME CARE INSTRUCTIONS   Do not drive for 24 hours or as instructed.  Only take over-the-counter or prescription medicines for pain, discomfort, or fever as directed by your caregiver.  Do not take aspirin. It can cause or aggravate bleeding.  Do not drive or drink  alcohol while taking pain medicine.  You may resume your usual diet.  Do not use tampons, douche, or have sexual intercourse for 2 weeks, or as advised by your caregiver.  Rest and sleep for the first 24 to 48 hours.  Take your temperature twice a day for 4 to 5 days. Write it down. Give these temperatures to your caregiver if they are abnormal (above 98.6 F or 37.0 C).  Take medicines your caregiver has ordered as directed.  Follow your caregiver's advice regarding diet, exercise, lifting, driving, and general activities.  Take showers instead of baths for 2 weeks, or as recommended by your caregiver.  If you develop constipation:  Take a mild laxative with the advice of your caregiver.  Eat bran foods.  Drink enough water and fluids to keep your urine clear or pale yellow.  Try to have someone with you or available to you for the first 24 to 48 hours, especially if you had a general anesthetic.  Make sure you and your family understand everything about your operation and recovery.  Follow your caregiver's advice regarding follow-up appointments and Pap smears. SEEK MEDICAL CARE IF:   You feel dizzy or lightheaded.  You feel sick to your stomach (nauseous).  You develop abnormal vaginal discharge.  You develop a rash.  You have an abnormal reaction or allergy to your medicine.  You need stronger pain medicine. SEEK IMMEDIATE MEDICAL CARE IF:   Bleeding is heavier than a normal menstrual period or you have blood clots.  You have an oral temperature above 102 F (38.9 C), not controlled by medicine.  You have increasing cramps or pains not relieved with medicine.  You develop belly (abdominal) pain that does not seem to be related to the same area of earlier cramping and pain.  You pass out.  You develop pain in the tops of your shoulders (shoulder strap areas).  You develop shortness of breath. MAKE SURE YOU:   Understand these instructions.  Will watch  your condition.  Will get help right away if you are not doing well or get worse. Document Released: 03/29/2000 Document Revised: 03/15/2011 Document Reviewed: 07/22/2008 Exit[employer] Patient Information 2014 [employer].  

## 2012-08-11 NOTE — Progress Notes (Signed)
Patient presented to the office today for 3 months followup ultrasound/sono histogram as a result of a submucous myoma. The patient was placed on Lupron 11.25 mg at that time. Previous ultrasound before the Lupron demonstrated the following:  Uterus 10.6 x 6.2 x 4.6 and meters with endometrial stripe of 5.1 mm. An endometrial fibroid measuring 3.3 x 2.5 x 2.9 cm was seen. The IUD was seen transversely on top of the fibroid in the fundus. Right ovary was normal. A left ovarian echo-free thinwall avascular cyst measuring 3.5 x 3.4 x 2.3 cm was noted. No free fluid in the cul-de-sac.  No bleeding reported ultrasound today/sonohysterogram : Uterus measured 8.3 x 5.3 x 4.2 cm endometrial stripe 4.5 mm a single fibroid measuring 22 x 16 mm was noted in the endometrial cavity IUD was seen posteriorly behind a fibroid. After the normal saline was instilled into the uterine cavity fundal fibroid measuring 20 x 22 x 27 mm was noted . 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 Patient does not want to proceed with hysterectomy at the present time. Submucous myoma hasn't decreased in size somewhat. Patient will be scheduled for a resectoscopic myomectomy and removal of the IUD. We'll then replaced with a new IUD 3 months postop. Literature and information was provided. We will see the patient one week before surgery for preoperative exam and to answer any additional questions. Description of procedure plan discussed today with patient

## 2012-08-22 ENCOUNTER — Encounter (HOSPITAL_COMMUNITY): Payer: Self-pay

## 2012-08-31 ENCOUNTER — Encounter: Payer: Self-pay | Admitting: Gynecology

## 2012-08-31 ENCOUNTER — Ambulatory Visit (INDEPENDENT_AMBULATORY_CARE_PROVIDER_SITE_OTHER): Payer: BC Managed Care – PPO | Admitting: Gynecology

## 2012-08-31 VITALS — BP 122/70 | Ht 61.75 in | Wt 134.0 lb

## 2012-08-31 DIAGNOSIS — N938 Other specified abnormal uterine and vaginal bleeding: Secondary | ICD-10-CM

## 2012-08-31 DIAGNOSIS — Z01419 Encounter for gynecological examination (general) (routine) without abnormal findings: Secondary | ICD-10-CM

## 2012-08-31 DIAGNOSIS — N949 Unspecified condition associated with female genital organs and menstrual cycle: Secondary | ICD-10-CM

## 2012-08-31 DIAGNOSIS — E039 Hypothyroidism, unspecified: Secondary | ICD-10-CM

## 2012-08-31 DIAGNOSIS — D250 Submucous leiomyoma of uterus: Secondary | ICD-10-CM

## 2012-08-31 DIAGNOSIS — Z833 Family history of diabetes mellitus: Secondary | ICD-10-CM

## 2012-08-31 DIAGNOSIS — R350 Frequency of micturition: Secondary | ICD-10-CM

## 2012-08-31 LAB — URINALYSIS W MICROSCOPIC + REFLEX CULTURE
Bilirubin Urine: NEGATIVE
Glucose, UA: NEGATIVE mg/dL
Hgb urine dipstick: NEGATIVE
Ketones, ur: NEGATIVE mg/dL
Leukocytes, UA: NEGATIVE
Nitrite: NEGATIVE
Protein, ur: NEGATIVE mg/dL
Specific Gravity, Urine: 1.02 (ref 1.005–1.030)
Urobilinogen, UA: 0.2 mg/dL (ref 0.0–1.0)
pH: 7.5 (ref 5.0–8.0)

## 2012-08-31 LAB — CBC WITH DIFFERENTIAL/PLATELET
Basophils Absolute: 0 10*3/uL (ref 0.0–0.1)
Basophils Relative: 1 % (ref 0–1)
Eosinophils Absolute: 0.1 10*3/uL (ref 0.0–0.7)
Eosinophils Relative: 3 % (ref 0–5)
HCT: 37.8 % (ref 36.0–46.0)
Hemoglobin: 12.8 g/dL (ref 12.0–15.0)
Lymphocytes Relative: 35 % (ref 12–46)
Lymphs Abs: 1.8 10*3/uL (ref 0.7–4.0)
MCH: 30.8 pg (ref 26.0–34.0)
MCHC: 33.9 g/dL (ref 30.0–36.0)
MCV: 90.9 fL (ref 78.0–100.0)
Monocytes Absolute: 0.4 10*3/uL (ref 0.1–1.0)
Monocytes Relative: 8 % (ref 3–12)
Neutro Abs: 2.8 10*3/uL (ref 1.7–7.7)
Neutrophils Relative %: 53 % (ref 43–77)
Platelets: 274 10*3/uL (ref 150–400)
RBC: 4.16 MIL/uL (ref 3.87–5.11)
RDW: 13.2 % (ref 11.5–15.5)
WBC: 5.2 10*3/uL (ref 4.0–10.5)

## 2012-08-31 MED ORDER — METOCLOPRAMIDE HCL 10 MG PO TABS: 10.0000 mg | ORAL_TABLET | Freq: Three times a day (TID) | ORAL | Status: DC

## 2012-08-31 MED ORDER — OXYCODONE-ACETAMINOPHEN 5-500 MG PO CAPS: 1.0000 | ORAL_CAPSULE | ORAL | Status: DC | PRN

## 2012-08-31 NOTE — Patient Instructions (Signed)

## 2012-08-31 NOTE — Addendum Note (Signed)
Addended by: Bertram Savin A on: 08/31/2012 12:02 PM   Modules accepted: Orders

## 2012-08-31 NOTE — Progress Notes (Deleted)
Julia Wagner January 30, 1970 [id number]   History:    42 y.o.  for annual gyn exam ***  Past medical history,surgical history, family history and social history were all reviewed and documented in the EPIC chart.  Gynecologic History No LMP recorded. Patient is not currently having periods (Reason: IUD). Contraception: {method:5051} Last Pap: ***. Results were: {norm/abn:16337} Last mammogram: ***. Results were: {norm/abn:16337}  Obstetric History OB History  Gravida Para Term Preterm AB SAB TAB Ectopic Multiple Living  2 2        2     # Outcome Date GA Lbr Len/2nd Weight Sex Delivery Anes PTL Lv  2 PAR           1 PAR                ROS: A ROS was performed and pertinent positives and negatives are included in the history.  GENERAL: No fevers or chills. HEENT: No change in vision, no earache, sore throat or sinus congestion. NECK: No pain or stiffness. CARDIOVASCULAR: No chest pain or pressure. No palpitations. PULMONARY: No shortness of breath, cough or wheeze. GASTROINTESTINAL: No abdominal pain, nausea, vomiting or diarrhea, melena or bright red blood per rectum. GENITOURINARY: No urinary frequency, urgency, hesitancy or dysuria. MUSCULOSKELETAL: No joint or muscle pain, no back pain, no recent trauma. DERMATOLOGIC: No rash, no itching, no lesions. ENDOCRINE: No polyuria, polydipsia, no heat or cold intolerance. No recent change in weight. HEMATOLOGICAL: No anemia or easy bruising or bleeding. NEUROLOGIC: No headache, seizures, numbness, tingling or weakness. PSYCHIATRIC: No depression, no loss of interest in normal activity or change in sleep pattern.     Exam: chaperone present  BP 122/70  Ht 5' 1.75" (1.568 m)  Wt 134 lb (60.782 kg)  BMI 24.72 kg/m2  Body mass index is 24.72 kg/(m^2).  General appearance : Well developed well nourished female. No acute distress HEENT: Neck supple, trachea midline, no carotid bruits, no thyroidmegaly Lungs: Clear to auscultation, no rhonchi  or wheezes, or rib retractions  Heart: Regular rate and rhythm, no murmurs or gallops Breast:Examined in sitting and supine position were symmetrical in appearance, no palpable masses or tenderness,  no skin retraction, no nipple inversion, no nipple discharge, no skin discoloration, no axillary or supraclavicular lymphadenopathy Abdomen: no palpable masses or tenderness, no rebound or guarding Extremities: no edema or skin discoloration or tenderness  Pelvic:  Bartholin, Urethra, Skene Glands: Within normal limits             Vagina: No gross lesions or discharge  Cervix: No gross lesions or discharge  Uterus  ***, normal size, shape and consistency, non-tender and mobile  Adnexa  Without masses or tenderness  Anus and perineum  normal   Rectovaginal  normal sphincter tone without palpated masses or tenderness             Hemoccult ***     Assessment/Plan:  42 y.o. female for annual exam Reynaldo Minium H MD, 9:47 AM 08/31/2012

## 2012-08-31 NOTE — Progress Notes (Signed)
Julia Wagner is an 42 y.o. female for preoperative examination. Patient has history of dysfunctional uterine bleeding. 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 She had an ultrasound 04/14/2012 with the following result:   Ultrasound 05/03/2012 Uterus 10.6 x 6.2 x 4.6 and meters with endometrial stripe of 5.1 mm. An endometrial fibroid measuring 3.3 x 2.5 x 2.9 cm was seen. The IUD was seen transversely on top of the fibroid in the fundus. Right ovary was normal. A left ovarian echo-free thinwall avascular cyst measuring 3.5 x 3.4 x 2.3 cm was noted. No free fluid in the cul-de-sac  sonohysterogram done on that day demonstrated a solid focus measuring 2.2 x 3.0 x 2.6 mm in the endometrial cavity positive color flow and a feeder vessel was noted. The IUD was seen posterior and lateral to the fibroid. Both ovaries were normal. Sonohysterogram demonstrated a submucous fibroid measured 3.3 x 3.4 x 2.8 cm. Myometrial thickness 12 mm for the wall the fibroid.  Patient was placed on Lupron 11.25 mg IM for 3 months and return for followup sonohysterogram on August 8 with the following findings:  Uterus 10.6 x 6.2 x 4.6 and meters with endometrial stripe of 5.1 mm. An endometrial fibroid measuring 3.3 x 2.5 x 2.9 cm was seen. The IUD was seen transversely on top of the fibroid in the fundus. Right ovary was normal. A left ovarian echo-free thinwall avascular cyst measuring 3.5 x 3.4 x 2.3 cm was noted. No free fluid in the cul-de-sac.  No bleeding reported ultrasound today/sonohysterogram  : Uterus measured 8.3 x 5.3 x 4.2 cm endometrial stripe 4.5 mm a single fibroid measuring 22 x 16 mm was noted in the endometrial cavity IUD was seen posteriorly behind a fibroid. After the normal saline was instilled into the uterine cavity fundal fibroid measuring 20 x 22 x 27 mm was noted  Patient has history of hypothyroidism under medication  Patient is scheduled to undergo resectoscopic  myomectomy and and removal of the IUD.  Pertinent Gynecological History: Menses: Dysfunctional uterine bleeding Bleeding: dysfunctional uterine bleeding Contraception: IUD DES exposure: denies Blood transfusions: none Sexually transmitted diseases: no past history Previous GYN Procedures: Vaginal deliveries  Last mammogram: normal Date: 2014 Last pap: normal Date: 2012 OB History: G2, P2   Menstrual History: Menarche age: 69  No LMP recorded. Patient is not currently having periods (Reason: IUD).    Past Medical History  Diagnosis Date  . NSVD (normal spontaneous vaginal delivery)     X 2  . Hypothyroidism   . Anxiety   . IUD 09/04/2010    Mirena IUD  . Eczema     History reviewed. No pertinent past surgical history.  Family History  Problem Relation Age of Onset  . Hypertension Mother   . Diabetes Father   . Hypertension Father     Social History:  reports that she has never smoked. She has never used smokeless tobacco. She reports that she does not drink alcohol or use illicit drugs.  Allergies: No Known Allergies   (Not in a hospital admission)  REVIEW OF SYSTEMS: A ROS was performed and pertinent positives and negatives are included in the history.  GENERAL: No fevers or chills. HEENT: No change in vision, no earache, sore throat or sinus congestion. NECK: No pain or stiffness. CARDIOVASCULAR: No chest pain or pressure. No palpitations. PULMONARY: No shortness of breath, cough or wheeze. GASTROINTESTINAL: No abdominal pain, nausea, vomiting or diarrhea, melena or  bright red blood per rectum. GENITOURINARY: No urinary frequency, urgency, hesitancy or dysuria. MUSCULOSKELETAL: No joint or muscle pain, no back pain, no recent trauma. DERMATOLOGIC: No rash, no itching, no lesions. ENDOCRINE: No polyuria, polydipsia, no heat or cold intolerance. No recent change in weight. HEMATOLOGICAL: No anemia or easy bruising or bleeding. NEUROLOGIC: No headache, seizures, numbness,  tingling or weakness. PSYCHIATRIC: No depression, no loss of interest in normal activity or change in sleep pattern.     Blood pressure 122/70, height 5' 1.75" (1.568 m), weight 134 lb (60.782 kg).  Physical Exam:  HEENT:unremarkable Neck:Supple, midline, no thyroid megaly, no carotid bruits Lungs:  Clear to auscultation no rhonchi's or wheezes Heart:Regular rate and rhythm, no murmurs or gallops Breast Exam:symmetrical no palpable masses or tenderness and no supraclavicular or axillary lymphadenopathy Abdomen:soft nontender no rebound or guarding Pelvic:BUSwithin normal limits Vagina:no lesions or discharge Cervix:no lesions or discharge IUD string seen Uterus:anteverted normal size shape and consistency Adnexa:no palpable mass or tenderness Extremities: No cords, no edema Rectal:unremarkable  No results found for this or any previous visit (from the past 24 hour(s)).  No results found.  Assessment/Plan: Patient with dysfunctional uterine bleeding treated 2 submucous myoma. Patient had Lupron 11.25 mg IM prescribed 3 months ago to help with her bleeding. Patient with normal CBC. Patient will be scheduled to have her Mirena IUD removed and also undergo resectoscopic myomectomy in an outpatient setting.                        Patient was counseled as to the risk of surgery to include the following:  1. Infection (prohylactic antibiotics will be administered)  2. DVT/Pulmonary Embolism (prophylactic pneumo compression stockings will be used)  3.Trauma to internal organs requiring additional surgical procedure to repair any injury to     Internal organs requiring perhaps additional hospitalization days.  4.Hemmorhage requiring transfusion and blood products which carry risks such as   anaphylactic reaction, hepatitis and AIDS  Patient had received literature information on the procedure scheduled and all her questions were answered and fully accepts all risk.  Patient was  provided with prescription Tylox to take one by mouth every 4-6 hours when necessary as well as Reglan 10 mg 1 by mouth every 4-6 hours when necessary to take postop. She has her two-week postop appointment scheduled.  [facility] HMD11:00 AMTD@  Reynaldo Minium H 08/31/2012, 10:51 AM

## 2012-09-01 LAB — COMPREHENSIVE METABOLIC PANEL
ALT: 25 U/L (ref 0–35)
AST: 29 U/L (ref 0–37)
Albumin: 4.7 g/dL (ref 3.5–5.2)
Alkaline Phosphatase: 69 U/L (ref 39–117)
BUN: 16 mg/dL (ref 6–23)
CO2: 29 mEq/L (ref 19–32)
Calcium: 9.7 mg/dL (ref 8.4–10.5)
Chloride: 103 mEq/L (ref 96–112)
Creat: 0.63 mg/dL (ref 0.50–1.10)
Glucose, Bld: 80 mg/dL (ref 70–99)
Potassium: 4.4 mEq/L (ref 3.5–5.3)
Sodium: 138 mEq/L (ref 135–145)
Total Bilirubin: 2.4 mg/dL — ABNORMAL HIGH (ref 0.3–1.2)
Total Protein: 7.1 g/dL (ref 6.0–8.3)

## 2012-09-01 LAB — URINE CULTURE
Colony Count: NO GROWTH
Organism ID, Bacteria: NO GROWTH

## 2012-09-01 LAB — LIPID PANEL
Cholesterol: 148 mg/dL (ref 0–200)
HDL: 52 mg/dL (ref >39)
LDL Cholesterol: 76 mg/dL (ref 0–99)
Total CHOL/HDL Ratio: 2.8 Ratio
Triglycerides: 102 mg/dL (ref <150)
VLDL: 20 mg/dL (ref 0–40)

## 2012-09-01 LAB — TSH: TSH: 2.661 u[IU]/mL (ref 0.350–4.500)

## 2012-09-05 ENCOUNTER — Other Ambulatory Visit: Payer: Self-pay | Admitting: Gynecology

## 2012-09-05 ENCOUNTER — Encounter (HOSPITAL_COMMUNITY): Payer: Self-pay | Admitting: Pharmacist

## 2012-09-05 DIAGNOSIS — R17 Unspecified jaundice: Secondary | ICD-10-CM

## 2012-09-06 ENCOUNTER — Ambulatory Visit: Payer: BC Managed Care – PPO

## 2012-09-06 DIAGNOSIS — R17 Unspecified jaundice: Secondary | ICD-10-CM

## 2012-09-07 ENCOUNTER — Other Ambulatory Visit: Payer: BC Managed Care – PPO

## 2012-09-07 LAB — BILIRUBIN,DIRECT & INDIRECT (FRACTIONATED)
Bilirubin, Direct: 0.3 mg/dL (ref 0.0–0.3)
Indirect Bilirubin: 1.5 mg/dL — ABNORMAL HIGH (ref 0.0–0.9)

## 2012-09-07 LAB — BILIRUBIN, TOTAL: Total Bilirubin: 1.8 mg/dL — ABNORMAL HIGH (ref 0.3–1.2)

## 2012-09-08 ENCOUNTER — Telehealth: Payer: Self-pay | Admitting: Gastroenterology

## 2012-09-08 NOTE — Telephone Encounter (Signed)
Pt has elevated bili and is supposed to have surgery next week. Dr. Lily Peer wants to make sure she does not have Gilbert's syndrome and wants her clear for surgery. Pt scheduled to see Dr. Arlyce Dice 09/11/12@9 :45am. Pt aware of appt.

## 2012-09-11 ENCOUNTER — Encounter: Payer: Self-pay | Admitting: Gastroenterology

## 2012-09-11 ENCOUNTER — Ambulatory Visit (INDEPENDENT_AMBULATORY_CARE_PROVIDER_SITE_OTHER): Payer: BC Managed Care – PPO | Admitting: Gastroenterology

## 2012-09-11 VITALS — BP 108/70 | HR 76 | Ht 62.25 in | Wt 136.4 lb

## 2012-09-11 DIAGNOSIS — R945 Abnormal results of liver function studies: Secondary | ICD-10-CM

## 2012-09-11 NOTE — Patient Instructions (Signed)
  Please follow up with Dr. Arlyce Dice in one year or sooner if symptoms reoccur. ______________________________________________________________________                                               We are excited to introduce MyChart, a new best-in-class service that provides you online access to important information in your electronic medical record. We want to make it easier for you to view your health information - all in one secure location - when and where you need it. We expect MyChart will enhance the quality of care and service we provide.  When you register for MyChart, you can:    View your test results.    Request appointments and receive appointment reminders via email.    Request medication renewals.    View your medical history, allergies, medications and immunizations.    Communicate with your physician's office through a password-protected site.    Conveniently print information such as your medication lists.  To find out if MyChart is right for you, please talk to a member of our clinical staff today. We will gladly answer your questions about this free health and wellness tool.  If you are age 8 or older and want a member of your family to have access to your record, you must provide written consent by completing a proxy form available at our office. Please speak to our clinical staff about guidelines regarding accounts for patients younger than age 33.  As you activate your MyChart account and need any technical assistance, please call the MyChart technical support line at [phone]-CHART [phone]) or email your question to [email or web address]. If you email your question(s), please include your name, a return phone number and the best time to reach you.  If you have non-urgent health-related questions, you can send a message to our office through MyChart at [email or web address]. If you have a medical emergency, call 911.  Thank you for using MyChart as your  new health and wellness resource!   MyChart licensed from [employer],  [id number]. Patents Pending.

## 2012-09-11 NOTE — Assessment & Plan Note (Signed)
Patient has a mild indirect hyperbilirubinemia.  This may reflect Gilbert's  Syndrome.  Doubt underlying liver disease.  Recommendations #1 no further GI workup #2 okay for GYN procedure

## 2012-09-11 NOTE — Progress Notes (Signed)
History of Present Illness: Pleasant 42 year old white female referred at the request of Dr. Lily Peer for evaluation of elevated bilirubin.  This was noted on routine testing.  Patient has no GI complaints including abdominal pain, pruritus, history of jaundice or hepatitis.  Family history is negative for liver disease.    Past Medical History  Diagnosis Date  . NSVD (normal spontaneous vaginal delivery)     X 2  . Hypothyroidism   . Anxiety   . IUD 09/04/2010    Mirena IUD  . Eczema   . Kidney stones   . Fibroid, uterine    History reviewed. No pertinent past surgical history. family history includes Colon cancer in her father; Colon polyps in her mother; Diabetes in her father and other; Heart disease in her father, paternal grandfather, and paternal grandmother; Hypertension in her father and mother; Kidney disease in her father. Current Outpatient Prescriptions  Medication Sig Dispense Refill  . levothyroxine (SYNTHROID, LEVOTHROID) 50 MCG tablet Take 1 tablet (50 mcg total) by mouth daily.  30 tablet  5  . metoCLOPramide (REGLAN) 10 MG tablet Take 1 tablet (10 mg total) by mouth 3 (three) times daily with meals.  30 tablet  1  . oxyCODONE-acetaminophen (TYLOX) 5-500 MG per capsule Take 1 capsule by mouth every 4 (four) hours as needed for pain.  30 capsule  0   No current facility-administered medications for this visit.   Allergies as of 09/11/2012  . (No Known Allergies)    reports that she has never smoked. She has never used smokeless tobacco. She reports that she does not drink alcohol or use illicit drugs.     Review of Systems: Pertinent positive and negative review of systems were noted in the above HPI section. All other review of systems were otherwise negative.  Vital signs were reviewed in today's medical record Physical Exam: General: Well developed , well nourished, no acute distress Skin: anicteric Head: Normocephalic and atraumatic Eyes:  sclerae  anicteric, EOMI Ears: Normal auditory acuity Mouth: No deformity or lesions Neck: Supple, no masses or thyromegaly Lungs: Clear throughout to auscultation Heart: Regular rate and rhythm; no murmurs, rubs or bruits Abdomen: Soft, non tender and non distended. No masses, hepatosplenomegaly or hernias noted. Normal Bowel sounds Rectal:deferred Musculoskeletal: Symmetrical with no gross deformities  Skin: No lesions on visible extremities Pulses:  Normal pulses noted Extremities: No clubbing, cyanosis, edema or deformities noted Neurological: Alert oriented x 4, grossly nonfocal Cervical Nodes:  No significant cervical adenopathy Inguinal Nodes: No significant inguinal adenopathy Psychological:  Alert and cooperative. Normal mood and affect

## 2012-09-12 MED ORDER — DEXTROSE 5 % IV SOLN
2.0000 g | INTRAVENOUS | Status: AC
  Administered 2012-09-13: 2 g via INTRAVENOUS
  Filled 2012-09-12: qty 2

## 2012-09-13 ENCOUNTER — Encounter (HOSPITAL_COMMUNITY)
Admission: RE | Disposition: A | Discharge status: Home / Self Care | Payer: Self-pay | Source: Ambulatory Visit | Attending: Gynecology

## 2012-09-13 ENCOUNTER — Ambulatory Visit (HOSPITAL_COMMUNITY): Payer: BC Managed Care – PPO | Admitting: Anesthesiology

## 2012-09-13 ENCOUNTER — Ambulatory Visit (HOSPITAL_COMMUNITY)
Admission: RE | Admit: 2012-09-13 | Discharge: 2012-09-13 | Disposition: A | Discharge status: Home / Self Care | Payer: BC Managed Care – PPO | Source: Ambulatory Visit | Attending: Gynecology | Admitting: Gynecology

## 2012-09-13 ENCOUNTER — Encounter (HOSPITAL_COMMUNITY): Payer: Self-pay | Admitting: Anesthesiology

## 2012-09-13 DIAGNOSIS — D250 Submucous leiomyoma of uterus: Secondary | ICD-10-CM | POA: Insufficient documentation

## 2012-09-13 DIAGNOSIS — Z30431 Encounter for routine checking of intrauterine contraceptive device: Secondary | ICD-10-CM

## 2012-09-13 DIAGNOSIS — N920 Excessive and frequent menstruation with regular cycle: Secondary | ICD-10-CM | POA: Diagnosis present

## 2012-09-13 DIAGNOSIS — D259 Leiomyoma of uterus, unspecified: Secondary | ICD-10-CM

## 2012-09-13 DIAGNOSIS — Z975 Presence of (intrauterine) contraceptive device: Secondary | ICD-10-CM | POA: Diagnosis not present

## 2012-09-13 DIAGNOSIS — Z9889 Other specified postprocedural states: Secondary | ICD-10-CM

## 2012-09-13 HISTORY — PX: IUD REMOVAL: SHX5392

## 2012-09-13 HISTORY — PX: DILATATION & CURETTAGE/HYSTEROSCOPY WITH TRUECLEAR: SHX6353

## 2012-09-13 LAB — URINALYSIS, ROUTINE W REFLEX MICROSCOPIC
Bilirubin Urine: NEGATIVE
Glucose, UA: NEGATIVE mg/dL
Ketones, ur: NEGATIVE mg/dL
Leukocytes, UA: NEGATIVE
Nitrite: NEGATIVE
Protein, ur: NEGATIVE mg/dL
Specific Gravity, Urine: 1.02 (ref 1.005–1.030)
Urobilinogen, UA: 0.2 mg/dL (ref 0.0–1.0)
pH: 6 (ref 5.0–8.0)

## 2012-09-13 LAB — BASIC METABOLIC PANEL
BUN: 12 mg/dL (ref 6–23)
CO2: 24 mEq/L (ref 19–32)
Calcium: 7.8 mg/dL — ABNORMAL LOW (ref 8.4–10.5)
Chloride: 107 mEq/L (ref 96–112)
Creatinine, Ser: 0.5 mg/dL (ref 0.50–1.10)
GFR calc Af Amer: >90 mL/min (ref >90)
GFR calc non Af Amer: >90 mL/min (ref >90)
Glucose, Bld: 100 mg/dL — ABNORMAL HIGH (ref 70–99)
Potassium: 3.5 mEq/L (ref 3.5–5.1)
Sodium: 138 mEq/L (ref 135–145)

## 2012-09-13 LAB — PREGNANCY, URINE: Preg Test, Ur: NEGATIVE

## 2012-09-13 LAB — CBC
HCT: 38.7 % (ref 36.0–46.0)
Hemoglobin: 13.1 g/dL (ref 12.0–15.0)
MCH: 31 pg (ref 26.0–34.0)
MCHC: 33.9 g/dL (ref 30.0–36.0)
MCV: 91.7 fL (ref 78.0–100.0)
Platelets: 257 10*3/uL (ref 150–400)
RBC: 4.22 MIL/uL (ref 3.87–5.11)
RDW: 12.6 % (ref 11.5–15.5)
WBC: 6.4 10*3/uL (ref 4.0–10.5)

## 2012-09-13 LAB — URINE MICROSCOPIC-ADD ON

## 2012-09-13 SURGERY — DILATATION & CURETTAGE/HYSTEROSCOPY WITH TRUCLEAR
Anesthesia: General

## 2012-09-13 MED ORDER — ONDANSETRON HCL 4 MG/2ML IJ SOLN
INTRAMUSCULAR | Status: AC
  Filled 2012-09-13: qty 2

## 2012-09-13 MED ORDER — DEXAMETHASONE SODIUM PHOSPHATE 10 MG/ML IJ SOLN
INTRAMUSCULAR | Status: DC | PRN
  Administered 2012-09-13: 10 mg via INTRAVENOUS

## 2012-09-13 MED ORDER — LIDOCAINE HCL (CARDIAC) 20 MG/ML IV SOLN
INTRAVENOUS | Status: DC | PRN
  Administered 2012-09-13: 80 mg via INTRAVENOUS

## 2012-09-13 MED ORDER — FENTANYL CITRATE 0.05 MG/ML IJ SOLN
INTRAMUSCULAR | Status: AC
  Filled 2012-09-13: qty 2

## 2012-09-13 MED ORDER — METOCLOPRAMIDE HCL 5 MG/ML IJ SOLN: 10.0000 mg | Freq: Once | INTRAMUSCULAR | Status: DC | PRN

## 2012-09-13 MED ORDER — DEXAMETHASONE SODIUM PHOSPHATE 10 MG/ML IJ SOLN
INTRAMUSCULAR | Status: AC
  Filled 2012-09-13: qty 1

## 2012-09-13 MED ORDER — PROPOFOL 10 MG/ML IV EMUL
INTRAVENOUS | Status: AC
  Filled 2012-09-13: qty 20

## 2012-09-13 MED ORDER — FENTANYL CITRATE 0.05 MG/ML IJ SOLN
INTRAMUSCULAR | Status: DC | PRN
  Administered 2012-09-13 (×2): 25 ug via INTRAVENOUS
  Administered 2012-09-13: 50 ug via INTRAVENOUS

## 2012-09-13 MED ORDER — MIDAZOLAM HCL 2 MG/2ML IJ SOLN
INTRAMUSCULAR | Status: AC
  Filled 2012-09-13: qty 2

## 2012-09-13 MED ORDER — LIDOCAINE HCL (CARDIAC) 20 MG/ML IV SOLN
INTRAVENOUS | Status: AC
  Filled 2012-09-13: qty 5

## 2012-09-13 MED ORDER — PROPOFOL 10 MG/ML IV BOLUS
INTRAVENOUS | Status: DC | PRN
  Administered 2012-09-13: 50 mg via INTRAVENOUS
  Administered 2012-09-13: 150 mg via INTRAVENOUS

## 2012-09-13 MED ORDER — ONDANSETRON HCL 4 MG/2ML IJ SOLN
INTRAMUSCULAR | Status: DC | PRN
  Administered 2012-09-13: 4 mg via INTRAVENOUS

## 2012-09-13 MED ORDER — KETOROLAC TROMETHAMINE 30 MG/ML IJ SOLN: 15.0000 mg | Freq: Once | INTRAMUSCULAR | Status: DC | PRN

## 2012-09-13 MED ORDER — LACTATED RINGERS IV SOLN
INTRAVENOUS | Status: DC
  Administered 2012-09-13 (×2): via INTRAVENOUS

## 2012-09-13 MED ORDER — MIDAZOLAM HCL 2 MG/2ML IJ SOLN
INTRAMUSCULAR | Status: DC | PRN
  Administered 2012-09-13: 2 mg via INTRAVENOUS

## 2012-09-13 MED ORDER — FENTANYL CITRATE 0.05 MG/ML IJ SOLN
25.0000 ug | INTRAMUSCULAR | Status: DC | PRN
  Administered 2012-09-13: 25 ug via INTRAVENOUS

## 2012-09-13 SURGICAL SUPPLY — 30 items
BLADE INCISOR TRUC PLUS 2.9 (ABLATOR) IMPLANT
CANISTERS HI-FLOW 3000CC (CANNISTER) ×2 IMPLANT
CATH FOLEY 2WAY SLVR 30CC 16FR (CATHETERS) IMPLANT
CATH ROBINSON RED A/P 16FR (CATHETERS) ×2 IMPLANT
CLOTH BEACON ORANGE TIMEOUT ST (SAFETY) ×2 IMPLANT
CONTAINER PREFILL 10% NBF 60ML (FORM) ×4 IMPLANT
CORD ACTIVE DISPOSABLE (ELECTRODE)
CORD ELECTRO ACTIVE DISP (ELECTRODE) IMPLANT
DRAPE HYSTEROSCOPY (DRAPE) ×2 IMPLANT
DRESSING TELFA 8X3 (GAUZE/BANDAGES/DRESSINGS) ×2 IMPLANT
ELECT REM PT RETURN 9FT ADLT (ELECTROSURGICAL)
ELECT VAPORTRODE GRVD BAR (ELECTRODE) IMPLANT
ELECTRODE REM PT RTRN 9FT ADLT (ELECTROSURGICAL) IMPLANT
GLOVE BIOGEL PI IND STRL 8 (GLOVE) ×1 IMPLANT
GLOVE BIOGEL PI INDICATOR 8 (GLOVE) ×1
GLOVE ECLIPSE 7.5 STRL STRAW (GLOVE) ×4 IMPLANT
GOWN STRL REIN XL XLG (GOWN DISPOSABLE) ×4 IMPLANT
INCISOR TRUC PLUS BLADE 2.9 (ABLATOR)
KIT HYSTEROSCOPY TRUCLEAR (ABLATOR) IMPLANT
MORCELLATOR RECIP TRUCLEAR 4.0 (ABLATOR) ×4 IMPLANT
PACK VAGINAL MINOR WOMEN LF (CUSTOM PROCEDURE TRAY) ×2 IMPLANT
PAD OB MATERNITY 4.3X12.25 (PERSONAL CARE ITEMS) ×2 IMPLANT
PAD PREP 24X48 CUFFED NSTRL (MISCELLANEOUS) ×2 IMPLANT
PIPET BIOPSY ENDOMETRIAL 3MM (SUCTIONS) ×2 IMPLANT
PLUG CATH AND CAP STER (CATHETERS) IMPLANT
SET BERKELEY SUCTION TUBING (SUCTIONS) ×2 IMPLANT
SYR 30ML LL (SYRINGE) IMPLANT
TOWEL OR 17X24 6PK STRL BLUE (TOWEL DISPOSABLE) ×4 IMPLANT
TUBING CONNECTING 10 (TUBING) ×4 IMPLANT
WATER STERILE IRR 1000ML POUR (IV SOLUTION) ×2 IMPLANT

## 2012-09-13 NOTE — Anesthesia Postprocedure Evaluation (Signed)
  Anesthesia Post-op Note  Patient: Julia Wagner  Procedure(s) Performed: Procedure(s) with comments: DILATATION & CURETTAGE/HYSTEROSCOPY TRUCLEAR RESECTOSCOPIC MYOMECTOMY,  (N/A) - TRUCLEAR RESECTOSCOPIC MYOMECTOMY, REMOVAL OF IUD     INTRAUTERINE DEVICE (IUD) REMOVAL (N/A) Patient is awake and responsive. Pain and nausea are reasonably well controlled. Vital signs are stable and clinically acceptable. Oxygen saturation is clinically acceptable. There are no apparent anesthetic complications at this time. Patient is ready for discharge.

## 2012-09-13 NOTE — Transfer of Care (Signed)
Immediate Anesthesia Transfer of Care Note  Patient: Julia Wagner  Procedure(s) Performed: Procedure(s) with comments: DILATATION & CURETTAGE/HYSTEROSCOPY TRUCLEAR RESECTOSCOPIC MYOMECTOMY,  (N/A) - TRUCLEAR RESECTOSCOPIC MYOMECTOMY, REMOVAL OF IUD     INTRAUTERINE DEVICE (IUD) REMOVAL (N/A)  Patient Location: PACU  Anesthesia Type:General  Level of Consciousness: awake, alert , oriented and patient cooperative  Airway & Oxygen Therapy: Patient Spontanous Breathing, Oxygen via nasal cannula  Post-op Assessment: Report given to PACU RN, Post -op Vital signs reviewed and stable and Patient moving all extremities X 4  Post vital signs: Reviewed and stable  Complications: No apparent anesthesia complications

## 2012-09-13 NOTE — Interval H&P Note (Signed)
History and Physical Interval Note:  09/13/2012 12:56 PM  Julia Wagner  has presented today for surgery, with the diagnosis of MYOMA, IUD  The various methods of treatment have been discussed with the patient and family. After consideration of risks, benefits and other options for treatment, the patient has consented to  Procedure(s) with comments: DILATATION & CURETTAGE/HYSTEROSCOPY TRUCLEAR RESECTOSCOPIC MYOMECTOMY,  (N/A) - TRUCLEAR RESECTOSCOPIC MYOMECTOMY, REMOVAL OF IUD     INTRAUTERINE DEVICE (IUD) REMOVAL (N/A) as a surgical intervention .  The patient's history has been reviewed, patient examined, no change in status, stable for surgery.  I have reviewed the patient's chart and labs.  Questions were answered to the patient's satisfaction.     Ok Edwards

## 2012-09-13 NOTE — Op Note (Signed)
09/13/2012  5:40 PM  PATIENT:  Julia Wagner  42 y.o. female  PRE-OPERATIVE DIAGNOSIS:  MYOMA, IUD, menorrhagia  POST-OPERATIVE DIAGNOSIS: submucous myoma, menorrhagia  PROCEDURE:  Procedure(s): DILATATION & CURETTAGE/HYSTEROSCOPY TRUCLEAR RESECTOSCOPIC MYOMECTOMY,  INTRAUTERINE DEVICE (IUD) REMOVAL  SURGEON:  Surgeon(s): Ok Edwards, MD  ANESTHESIA:   general  FINDINGS:3 x 2 cm submucous myoma with anterior midportion of uterine wall attachment. Mirena IUD was identified as well.  DESCRIPTION OF OPERATION:the patient was taken to the operating room where she underwent successful general endotracheal anesthesia. A timeout was undertaken. Proper identification of the patient as well as the procedure to be undertaken was established. Patient received 1 g of Cefotan prophylactically as well as placement of PAS stockings. Patient was placed in the high lithotomy position a red rubber Robinson catheter was inserted into the bladder to evacuate its contents. Exam under anesthesia demonstrated a 6 week size anteverted uterus and no palpable adnexal masses. The vagina and perineum were prepped and draped in the usual sterile fashion. A single-tooth tenaculum was placed on the anterior cervical lip. The uterus sounded to 8 cm. The cervix was dilated to a size 25 Pratt dilator. The [facility] operative resectoscope 8 mm was utilized and normal saline was the distending media. A 4.0 mm alternate reciprocating blade (morcellator) was utilized. Systematic inspection of the uterine cavity demonstrated a 3 x 2 cm submucous myoma hypervascular broadbase attached to the midportion of the uterus. The submucous myoma was resected. Due to the fact that the myoma was broad based 90% of the myoma was removed. A 10 cc Foley catheter was placed into the uterus  to tamponade the small bleeders and to be removed 2 hours after being in the recovery room. The fluid deficit was 2300 cc and intraoperative  electrolytes were obtained with the following results:  Results for Julia Wagner, Julia Wagner (MRN [id number]) as of 09/13/2012 17:48  Ref. Range 09/13/2012 14:35  Sodium Latest Range: 135-145 mEq/L 138  Potassium Latest Range: 3.5-5.1 mEq/L 3.5  Chloride Latest Range: 96-112 mEq/L 107  CO2 Latest Range: 19-32 mEq/L 24  BUN Latest Range: 6-23 mg/dL 12  Creatinine Latest Range: 0.50-1.10 mg/dL 4.09  Calcium Latest Range: 8.4-10.5 mg/dL 7.8 (L)  GFR calc non Af Amer Latest Range: >90 mL/min >90  GFR calc Af Amer Latest Range: >90 mL/min >90  Glucose Latest Range: 70-99 mg/dL 811 (H)   The single-tooth tenaculum was removed. The patient was extubated transferred to recovery room with stable vital signs. Blood loss was less than 100 cc.  Of note: The IUD was removed before the resectoscopic myomectomy.  ESTIMATED BLOOD LOSS: less than 100 cc   Intake/Output Summary (Last 24 hours) at 09/13/12 1740 Last data filed at 09/13/12 1500  Gross per 24 hour  Intake   1200 ml  Output    375 ml  Net    825 ml     BLOOD ADMINISTERED:none   LOCAL MEDICATIONS USED:  NONE  SPECIMEN:  Source of Specimen:  Submucous myoma from the uterus  DISPOSITION OF SPECIMEN:  PATHOLOGY  COUNTS:  YES  PLAN OF CARE: Transfer to PACU  [facility] HMD5:40 PMTD@

## 2012-09-13 NOTE — Interval H&P Note (Signed)
History and Physical Interval Note:  09/13/2012 12:03 PM  Julia Wagner  has presented today for surgery, with the diagnosis of MYOMA, IUD  The various methods of treatment have been discussed with the patient and family. After consideration of risks, benefits and other options for treatment, the patient has consented to  Procedure(s) with comments: DILATATION & CURETTAGE/HYSTEROSCOPY TRUCLEAR RESECTOSCOPIC MYOMECTOMY,  (N/A) - TRUCLEAR RESECTOSCOPIC MYOMECTOMY, REMOVAL OF IUD     INTRAUTERINE DEVICE (IUD) REMOVAL (N/A) as a surgical intervention .  The patient's history has been reviewed, patient examined, no change in status, stable for surgery.  I have reviewed the patient's chart and labs.  Questions were answered to the patient's satisfaction.     Ok Edwards

## 2012-09-13 NOTE — Anesthesia Preprocedure Evaluation (Addendum)
Anesthesia Evaluation  Patient identified by MRN, date of birth, ID band Patient awake    Reviewed: Allergy & Precautions, H&P , NPO status , Patient's Chart, lab work & pertinent test results, reviewed documented beta blocker date and time   History of Anesthesia Complications Negative for: history of anesthetic complications  Airway Mallampati: II TM Distance: >3 FB Neck ROM: full    Dental  (+) Teeth Intact   Pulmonary neg pulmonary ROS,  breath sounds clear to auscultation  Pulmonary exam normal       Cardiovascular negative cardio ROS  Rhythm:regular Rate:Normal     Neuro/Psych PSYCHIATRIC DISORDERS (anxiety) negative neurological ROS     GI/Hepatic negative GI ROS, Neg liver ROS,   Endo/Other  Hypothyroidism   Renal/GU Renal disease (h/o kidney stones)  Female GU complaint     Musculoskeletal   Abdominal   Peds  Hematology negative hematology ROS (+)   Anesthesia Other Findings   Reproductive/Obstetrics negative OB ROS                          Anesthesia Physical Anesthesia Plan  ASA: II  Anesthesia Plan: General LMA   Post-op Pain Management:    Induction:   Airway Management Planned:   Additional Equipment:   Intra-op Plan:   Post-operative Plan:   Informed Consent: I have reviewed the patients History and Physical, chart, labs and discussed the procedure including the risks, benefits and alternatives for the proposed anesthesia with the patient or authorized representative who has indicated his/her understanding and acceptance.   Dental Advisory Given  Plan Discussed with: CRNA and Surgeon  Anesthesia Plan Comments:         Anesthesia Quick Evaluation

## 2012-09-13 NOTE — H&P (View-Only) (Signed)
Julia Wagner is an 42 y.o. female for preoperative examination. Patient has history of dysfunctional uterine bleeding. 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 She had an ultrasound 04/14/2012 with the following result:   Ultrasound 05/03/2012 Uterus 10.6 x 6.2 x 4.6 and meters with endometrial stripe of 5.1 mm. An endometrial fibroid measuring 3.3 x 2.5 x 2.9 cm was seen. The IUD was seen transversely on top of the fibroid in the fundus. Right ovary was normal. A left ovarian echo-free thinwall avascular cyst measuring 3.5 x 3.4 x 2.3 cm was noted. No free fluid in the cul-de-sac  sonohysterogram done on that day demonstrated a solid focus measuring 2.2 x 3.0 x 2.6 mm in the endometrial cavity positive color flow and a feeder vessel was noted. The IUD was seen posterior and lateral to the fibroid. Both ovaries were normal. Sonohysterogram demonstrated a submucous fibroid measured 3.3 x 3.4 x 2.8 cm. Myometrial thickness 12 mm for the wall the fibroid.  Patient was placed on Lupron 11.25 mg IM for 3 months and return for followup sonohysterogram on August 8 with the following findings:  Uterus 10.6 x 6.2 x 4.6 and meters with endometrial stripe of 5.1 mm. An endometrial fibroid measuring 3.3 x 2.5 x 2.9 cm was seen. The IUD was seen transversely on top of the fibroid in the fundus. Right ovary was normal. A left ovarian echo-free thinwall avascular cyst measuring 3.5 x 3.4 x 2.3 cm was noted. No free fluid in the cul-de-sac.  No bleeding reported ultrasound today/sonohysterogram  : Uterus measured 8.3 x 5.3 x 4.2 cm endometrial stripe 4.5 mm a single fibroid measuring 22 x 16 mm was noted in the endometrial cavity IUD was seen posteriorly behind a fibroid. After the normal saline was instilled into the uterine cavity fundal fibroid measuring 20 x 22 x 27 mm was noted  Patient has history of hypothyroidism under medication  Patient is scheduled to undergo resectoscopic  myomectomy and and removal of the IUD.  Pertinent Gynecological History: Menses: Dysfunctional uterine bleeding Bleeding: dysfunctional uterine bleeding Contraception: IUD DES exposure: denies Blood transfusions: none Sexually transmitted diseases: no past history Previous GYN Procedures: Vaginal deliveries  Last mammogram: normal Date: 2014 Last pap: normal Date: 2012 OB History: G2, P2   Menstrual History: Menarche age: 11  No LMP recorded. Patient is not currently having periods (Reason: IUD).    Past Medical History  Diagnosis Date  . NSVD (normal spontaneous vaginal delivery)     X 2  . Hypothyroidism   . Anxiety   . IUD 09/04/2010    Mirena IUD  . Eczema     History reviewed. No pertinent past surgical history.  Family History  Problem Relation Age of Onset  . Hypertension Mother   . Diabetes Father   . Hypertension Father     Social History:  reports that she has never smoked. She has never used smokeless tobacco. She reports that she does not drink alcohol or use illicit drugs.  Allergies: No Known Allergies   (Not in a hospital admission)  REVIEW OF SYSTEMS: A ROS was performed and pertinent positives and negatives are included in the history.  GENERAL: No fevers or chills. HEENT: No change in vision, no earache, sore throat or sinus congestion. NECK: No pain or stiffness. CARDIOVASCULAR: No chest pain or pressure. No palpitations. PULMONARY: No shortness of breath, cough or wheeze. GASTROINTESTINAL: No abdominal pain, nausea, vomiting or diarrhea, melena or   bright red blood per rectum. GENITOURINARY: No urinary frequency, urgency, hesitancy or dysuria. MUSCULOSKELETAL: No joint or muscle pain, no back pain, no recent trauma. DERMATOLOGIC: No rash, no itching, no lesions. ENDOCRINE: No polyuria, polydipsia, no heat or cold intolerance. No recent change in weight. HEMATOLOGICAL: No anemia or easy bruising or bleeding. NEUROLOGIC: No headache, seizures, numbness,  tingling or weakness. PSYCHIATRIC: No depression, no loss of interest in normal activity or change in sleep pattern.     Blood pressure 122/70, height 5' 1.75" (1.568 m), weight 134 lb (60.782 kg).  Physical Exam:  HEENT:unremarkable Neck:Supple, midline, no thyroid megaly, no carotid bruits Lungs:  Clear to auscultation no rhonchi's or wheezes Heart:Regular rate and rhythm, no murmurs or gallops Breast Exam:symmetrical no palpable masses or tenderness and no supraclavicular or axillary lymphadenopathy Abdomen:soft nontender no rebound or guarding Pelvic:BUSwithin normal limits Vagina:no lesions or discharge Cervix:no lesions or discharge IUD string seen Uterus:anteverted normal size shape and consistency Adnexa:no palpable mass or tenderness Extremities: No cords, no edema Rectal:unremarkable  No results found for this or any previous visit (from the past 24 hour(s)).  No results found.  Assessment/Plan: Patient with dysfunctional uterine bleeding treated 2 submucous myoma. Patient had Lupron 11.25 mg IM prescribed 3 months ago to help with her bleeding. Patient with normal CBC. Patient will be scheduled to have her Mirena IUD removed and also undergo resectoscopic myomectomy in an outpatient setting.                        Patient was counseled as to the risk of surgery to include the following:  1. Infection (prohylactic antibiotics will be administered)  2. DVT/Pulmonary Embolism (prophylactic pneumo compression stockings will be used)  3.Trauma to internal organs requiring additional surgical procedure to repair any injury to     Internal organs requiring perhaps additional hospitalization days.  4.Hemmorhage requiring transfusion and blood products which carry risks such as   anaphylactic reaction, hepatitis and AIDS  Patient had received literature information on the procedure scheduled and all her questions were answered and fully accepts all risk.  Patient was  provided with prescription Tylox to take one by mouth every 4-6 hours when necessary as well as Reglan 10 mg 1 by mouth every 4-6 hours when necessary to take postop. She has her two-week postop appointment scheduled.  Julia Wagner HMD11:00 AMTD@  Julia Wagner 08/31/2012, 10:51 AM  

## 2012-09-13 NOTE — Anesthesia Procedure Notes (Signed)
Procedure Name: LMA Insertion Date/Time: 09/13/2012 1:12 PM Performed by: Graciela Husbands Pre-anesthesia Checklist: Suction available, Emergency Drugs available, Timeout performed, Patient identified and Patient being monitored Patient Re-evaluated:Patient Re-evaluated prior to inductionOxygen Delivery Method: Circle system utilized Preoxygenation: Pre-oxygenation with 100% oxygen Intubation Type: IV induction Ventilation: Mask ventilation without difficulty LMA: LMA inserted LMA Size: 4.0 Number of attempts: 1 Placement Confirmation: positive ETCO2 and breath sounds checked- equal and bilateral Tube secured with: Tape Dental Injury: Teeth and Oropharynx as per pre-operative assessment

## 2012-09-14 ENCOUNTER — Encounter (HOSPITAL_COMMUNITY): Payer: Self-pay | Admitting: Gynecology

## 2012-09-29 ENCOUNTER — Encounter: Payer: Self-pay | Admitting: Gynecology

## 2012-09-29 ENCOUNTER — Ambulatory Visit (INDEPENDENT_AMBULATORY_CARE_PROVIDER_SITE_OTHER): Payer: BC Managed Care – PPO | Admitting: Gynecology

## 2012-09-29 VITALS — BP 116/70

## 2012-09-29 DIAGNOSIS — D250 Submucous leiomyoma of uterus: Secondary | ICD-10-CM

## 2012-09-29 DIAGNOSIS — Z09 Encounter for follow-up examination after completed treatment for conditions other than malignant neoplasm: Secondary | ICD-10-CM

## 2012-09-29 NOTE — Progress Notes (Signed)
Patient presented to the office today for a 2 week postop visit. Patient is status post resectoscopic myomectomy as a result of her menorrhagia despite having a Mirena IUD in place. Patient is doing well with very little if any bleeding. Findings from surgery were discussed with the patient as well as pictures were shared as follows:  The IUD was removed.Systematic inspection of the uterine cavity demonstrated a 3 x 2 cm submucous myoma hypervascular broadbase attached to the midportion of the uterus. The submucous myoma was resected. Due to the fact that the myoma was broad based 90% of the myoma was removed.  Pelvic exam: Bartholin urethra Skene was within normal limits Vagina: No lesions or discharge Cervix: No lesions or discharge Uterus: Anteverted normal size shape and consistency Adnexa: No palpable mass or tenderness Rectal exam not done  Pathology report as follows:  1. Intrauterine Device - GROSS DIAGNOSIS ONLY: MEDICAL DEVICE CONSISTENT WITH CLINICALLY STATED INTRAUTERINE DEVICE. 2. Endometrium, curettage - EXTREMELY SCANT (MINUTE) SUPERFICIAL ENDOMETRIAL TISSUE FRAGMENTS. - FRAGMENT OF BENIGN SMOOTH MUSCLE PRESENT. - NO ATYPIA OR MALIGNANCY IDENTIFIED. - SEE COMMENT. 3. Endometrium, resection, with myoma - NODULAR FRAGMENTS OF BENIGN SMOOTH MUSCLE, CLINICALLY LEIOMYOMA. - NO ATYPIA OR MALIGNANCY IDENTIFIED. Microscopic Comment 2. The minute endometrial tissue present in the endometrial curettage specimen is non-diagnostic for evaluation of endometrial pathology. The fragment of benign smooth muscle likely represents an additional fragment of leiomyoma. ROBERT  Assessment/plan: Patient 2 weeks status post resectoscope myomectomy 90% of the submucous myoma. Patient returned to the office in 3 months for a sonohysterogram and then to plan on reinserting a Mirena IUD. Patient will meanwhile use barrier contraception.

## 2012-10-12 ENCOUNTER — Other Ambulatory Visit: Payer: Self-pay

## 2012-10-12 DIAGNOSIS — E039 Hypothyroidism, unspecified: Secondary | ICD-10-CM

## 2012-10-12 MED ORDER — LEVOTHYROXINE SODIUM 50 MCG PO TABS: 50.0000 ug | ORAL_TABLET | Freq: Every day | ORAL | Status: DC

## 2012-11-15 ENCOUNTER — Telehealth: Payer: Self-pay | Admitting: *Deleted

## 2012-11-15 DIAGNOSIS — E039 Hypothyroidism, unspecified: Secondary | ICD-10-CM

## 2012-11-15 MED ORDER — LEVOTHYROXINE SODIUM 50 MCG PO TABS: 50.0000 ug | ORAL_TABLET | Freq: Every day | ORAL | Status: DC

## 2012-11-15 NOTE — Telephone Encounter (Signed)
Pt has new pharmacy [facility] will need her synthroid 50 mcg. rx sent with 90 day supply with 3 refills.

## 2012-12-21 ENCOUNTER — Other Ambulatory Visit: Payer: Self-pay | Admitting: Gynecology

## 2012-12-21 DIAGNOSIS — D250 Submucous leiomyoma of uterus: Secondary | ICD-10-CM

## 2012-12-21 DIAGNOSIS — D259 Leiomyoma of uterus, unspecified: Secondary | ICD-10-CM

## 2013-01-17 ENCOUNTER — Ambulatory Visit (INDEPENDENT_AMBULATORY_CARE_PROVIDER_SITE_OTHER): Payer: BC Managed Care – PPO

## 2013-01-17 ENCOUNTER — Ambulatory Visit (INDEPENDENT_AMBULATORY_CARE_PROVIDER_SITE_OTHER): Payer: BC Managed Care – PPO | Admitting: Gynecology

## 2013-01-17 ENCOUNTER — Other Ambulatory Visit: Payer: Self-pay | Admitting: Gynecology

## 2013-01-17 DIAGNOSIS — N8300 Follicular cyst of ovary, unspecified side: Secondary | ICD-10-CM

## 2013-01-17 DIAGNOSIS — N856 Intrauterine synechiae: Secondary | ICD-10-CM

## 2013-01-17 DIAGNOSIS — D250 Submucous leiomyoma of uterus: Secondary | ICD-10-CM

## 2013-01-17 DIAGNOSIS — N920 Excessive and frequent menstruation with regular cycle: Secondary | ICD-10-CM

## 2013-01-17 DIAGNOSIS — D259 Leiomyoma of uterus, unspecified: Secondary | ICD-10-CM

## 2013-01-17 NOTE — Progress Notes (Signed)
   Patient presented to the office today for a sonohysterogram. Patient was seen in the office on 09/29/2012 for her two-week postoperative visit after having undergone resectoscopic myomectomy as a result of her menorrhagia despite having had a Mirena IUD. Her IUD had been removed the myoma was resected pathology report benign. She is here for sonohysterogram make sure that her cavity is clear so that with her upcoming menstrual cycle we will start with a Mirena IUD once again for cycle control as well as contraception. Last menstrual period early this month was normal. She is been using barrier contraception:  Ultrasound: Uterus measuring 9.5 x 6.6 x 4.3 cm with endometrial stripe of 3.5 mm. Small) follicle measuring 20 mm was noted. Left ovary was normal. No fluid in the cul-de-sac. After instilling normal saline into the intrauterine cavity there was no defect seen only a very thin small synechia.  Assessment/plan: Patient 2014 had resectoscopic myomectomy secondary dysfunctional uterine bleeding despite having had an IUD in. Sonohysterogram clear for her to return to the office to start her next menstrual cycle to place a Mirena IUD. Meanwhile patient will continue to use barrier contraception.

## 2013-01-19 ENCOUNTER — Ambulatory Visit: Payer: BC Managed Care – PPO | Admitting: Gynecology

## 2013-01-19 ENCOUNTER — Other Ambulatory Visit: Payer: BC Managed Care – PPO

## 2013-02-01 ENCOUNTER — Telehealth: Payer: Self-pay | Admitting: Gynecology

## 2013-02-01 ENCOUNTER — Other Ambulatory Visit: Payer: Self-pay | Admitting: Gynecology

## 2013-02-01 DIAGNOSIS — Z3049 Encounter for surveillance of other contraceptives: Secondary | ICD-10-CM

## 2013-02-01 MED ORDER — LEVONORGESTREL 20 MCG/24HR IU IUD: INTRAUTERINE_SYSTEM | Freq: Once | INTRAUTERINE | Status: AC

## 2013-02-01 NOTE — Telephone Encounter (Signed)
02/01/13-I LM VM on pt cell that her insurances both cover the Mirena & Insertion at 100%. She is to call first day of cycle to schedule insertion/wl

## 2013-02-06 ENCOUNTER — Encounter: Payer: Self-pay | Admitting: Gynecology

## 2013-02-06 ENCOUNTER — Ambulatory Visit (INDEPENDENT_AMBULATORY_CARE_PROVIDER_SITE_OTHER): Payer: BC Managed Care – PPO | Admitting: Gynecology

## 2013-02-06 VITALS — BP 114/70

## 2013-02-06 DIAGNOSIS — Z30430 Encounter for insertion of intrauterine contraceptive device: Secondary | ICD-10-CM

## 2013-02-06 NOTE — Patient Instructions (Signed)
Levonorgestrel intrauterine device (IUD) What is this medicine? LEVONORGESTREL IUD (LEE voe nor jes trel) is a contraceptive (birth control) device. The device is placed inside the uterus by a healthcare professional. It is used to prevent pregnancy and can also be used to treat heavy bleeding that occurs during your period. Depending on the device, it can be used for 3 to 5 years. This medicine may be used for other purposes; ask your health care provider or pharmacist if you have questions. COMMON BRAND NAME(S): Mirena, Skyla What should I tell my health care provider before I take this medicine? They need to know if you have any of these conditions: -abnormal Pap smear -cancer of the breast, uterus, or cervix -diabetes -endometritis -genital or pelvic infection now or in the past -have more than one sexual partner or your partner has more than one partner -heart disease -history of an ectopic or tubal pregnancy -immune system problems -IUD in place -liver disease or tumor -problems with blood clots or take blood-thinners -use intravenous drugs -uterus of unusual shape -vaginal bleeding that has not been explained -an unusual or allergic reaction to levonorgestrel, other hormones, silicone, or polyethylene, medicines, foods, dyes, or preservatives -pregnant or trying to get pregnant -breast-feeding How should I use this medicine? This device is placed inside the uterus by a health care professional. Talk to your pediatrician regarding the use of this medicine in children. Special care may be needed. Overdosage: If you think you have taken too much of this medicine contact a poison control center or emergency room at once. NOTE: This medicine is only for you. Do not share this medicine with others. What if I miss a dose? This does not apply. What may interact with this medicine? Do not take this medicine with any of the following  medications: -amprenavir -bosentan -fosamprenavir This medicine may also interact with the following medications: -aprepitant -barbiturate medicines for inducing sleep or treating seizures -bexarotene -griseofulvin -medicines to treat seizures like carbamazepine, ethotoin, felbamate, oxcarbazepine, phenytoin, topiramate -modafinil -pioglitazone -rifabutin -rifampin -rifapentine -some medicines to treat HIV infection like atazanavir, indinavir, lopinavir, nelfinavir, tipranavir, ritonavir -St. John's wort -warfarin This list may not describe all possible interactions. Give your health care provider a list of all the medicines, herbs, non-prescription drugs, or dietary supplements you use. Also tell them if you smoke, drink alcohol, or use illegal drugs. Some items may interact with your medicine. What should I watch for while using this medicine? Visit your doctor or health care professional for regular check ups. See your doctor if you or your partner has sexual contact with others, becomes HIV positive, or gets a sexual transmitted disease. This product does not protect you against HIV infection (AIDS) or other sexually transmitted diseases. You can check the placement of the IUD yourself by reaching up to the top of your vagina with clean fingers to feel the threads. Do not pull on the threads. It is a good habit to check placement after each menstrual period. Call your doctor right away if you feel more of the IUD than just the threads or if you cannot feel the threads at all. The IUD may come out by itself. You may become pregnant if the device comes out. If you notice that the IUD has come out use a backup birth control method like condoms and call your health care provider. Using tampons will not change the position of the IUD and are okay to use during your period. What side effects may I   notice from receiving this medicine? Side effects that you should report to your doctor or  health care professional as soon as possible: -allergic reactions like skin rash, itching or hives, swelling of the face, lips, or tongue -fever, flu-like symptoms -genital sores -high blood pressure -no menstrual period for 6 weeks during use -pain, swelling, warmth in the leg -pelvic pain or tenderness -severe or sudden headache -signs of pregnancy -stomach cramping -sudden shortness of breath -trouble with balance, talking, or walking -unusual vaginal bleeding, discharge -yellowing of the eyes or skin Side effects that usually do not require medical attention (report to your doctor or health care professional if they continue or are bothersome): -acne -breast pain -change in sex drive or performance -changes in weight -cramping, dizziness, or faintness while the device is being inserted -headache -irregular menstrual bleeding within first 3 to 6 months of use -nausea This list may not describe all possible side effects. Call your doctor for medical advice about side effects. You may report side effects to FDA at 1-800-FDA-1088. Where should I keep my medicine? This does not apply. NOTE: This sheet is a summary. It may not cover all possible information. If you have questions about this medicine, talk to your doctor, pharmacist, or health care provider.  2014, [employer] Standard. (2011-01-21 13:54:04)  

## 2013-02-06 NOTE — Progress Notes (Signed)
                                                                     IUD procedure note       Patient presented to the office today for placement of Mirena IUD. The patient had previously been provided with literature information on this method of contraception. The risks benefits and pros and cons were discussed and all her questions were answered. She is fully aware that this form of contraception is 99% effective and is good for 5 years.  Pelvic exam: Bartholin urethra Skene glands: Within normal limits Vagina: No lesions or discharge Cervix: No lesions or discharge Uterus: Anteverted position Adnexa: No masses or tenderness Rectal exam: Not done  The cervix was cleansed with Betadine solution. A single-tooth tenaculum was placed on the anterior cervical lip. The uterus sounded to 6-1/2 centimeter. The IUD was shown to the patient and inserted in a sterile fashion. The IUD string was trimmed. The single-tooth tenaculum was removed. Patient was instructed to return back to the office in one month for follow up.

## 2013-03-09 ENCOUNTER — Ambulatory Visit (INDEPENDENT_AMBULATORY_CARE_PROVIDER_SITE_OTHER): Payer: BC Managed Care – PPO | Admitting: Gynecology

## 2013-03-09 ENCOUNTER — Encounter: Payer: Self-pay | Admitting: Gynecology

## 2013-03-09 VITALS — BP 120/76

## 2013-03-09 DIAGNOSIS — Z30431 Encounter for routine checking of intrauterine contraceptive device: Secondary | ICD-10-CM

## 2013-03-09 NOTE — Progress Notes (Signed)
   Patient presented to the office today for one month followup after having the Mirena IUD placed for contraception cycle control. 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  Patient is doing well has complained of some mild bloating since the placement of Mirena IUD and some breast tenderness.  Exam: Pelvic: Bartholin urethra Skene was within normal limits Vagina: No lesions or discharge Cervix: No lesions or discharge IUD string visualized Uterus: Anteverted normal size shape and consistency Adnexa: No palpable mass or tenderness Rectal exam: Not done  Assessment/plan: Patient one month status post placement of Mirena IUD some mild bloating and breast tenderness is to be expected patient was reassured. Patient otherwise scheduled to return to the office at the end of this year for annual exam or when necessary.

## 2013-07-03 ENCOUNTER — Encounter: Payer: Self-pay | Admitting: Gynecology

## 2013-07-03 ENCOUNTER — Ambulatory Visit (INDEPENDENT_AMBULATORY_CARE_PROVIDER_SITE_OTHER): Payer: Managed Care, Other (non HMO) | Admitting: Gynecology

## 2013-07-03 DIAGNOSIS — R300 Dysuria: Secondary | ICD-10-CM

## 2013-07-03 DIAGNOSIS — N3000 Acute cystitis without hematuria: Secondary | ICD-10-CM

## 2013-07-03 DIAGNOSIS — N898 Other specified noninflammatory disorders of vagina: Secondary | ICD-10-CM

## 2013-07-03 LAB — URINALYSIS W MICROSCOPIC + REFLEX CULTURE
Bilirubin Urine: NEGATIVE
Casts: NONE SEEN
Crystals: NONE SEEN
Glucose, UA: NEGATIVE mg/dL
Hgb urine dipstick: NEGATIVE
Ketones, ur: NEGATIVE mg/dL
Nitrite: POSITIVE — AB
Protein, ur: 30 mg/dL — AB
RBC / HPF: NONE SEEN RBC/hpf (ref <3)
Specific Gravity, Urine: 1.015 (ref 1.005–1.030)
Urobilinogen, UA: 1 mg/dL (ref 0.0–1.0)
pH: 8.5 — ABNORMAL HIGH (ref 5.0–8.0)

## 2013-07-03 LAB — WET PREP FOR TRICH, YEAST, CLUE
Clue Cells Wet Prep HPF POC: NONE SEEN
Trich, Wet Prep: NONE SEEN

## 2013-07-03 MED ORDER — FLUCONAZOLE 150 MG PO TABS: 150.0000 mg | ORAL_TABLET | Freq: Once | ORAL | Status: DC

## 2013-07-03 MED ORDER — SULFAMETHOXAZOLE-TRIMETHOPRIM 800-160 MG PO TABS: 1.0000 | ORAL_TABLET | Freq: Two times a day (BID) | ORAL | Status: DC

## 2013-07-03 NOTE — Patient Instructions (Signed)
Takes Septra antibiotic twice daily for 3 days. Take Diflucan tablet once. Followup if symptoms persist, worsen or recur.  Urinary Tract Infection Urinary tract infections (UTIs) can develop anywhere along your urinary tract. Your urinary tract is your body's drainage system for removing wastes and extra water. Your urinary tract includes two kidneys, two ureters, a bladder, and a urethra. Your kidneys are a pair of bean-shaped organs. Each kidney is about the size of your fist. They are located below your ribs, one on each side of your spine. CAUSES Infections are caused by microbes, which are microscopic organisms, including fungi, viruses, and bacteria. These organisms are so small that they can only be seen through a microscope. Bacteria are the microbes that most commonly cause UTIs. SYMPTOMS  Symptoms of UTIs may vary by age and gender of the patient and by the location of the infection. Symptoms in young women typically include a frequent and intense urge to urinate and a painful, burning feeling in the bladder or urethra during urination. Older women and men are more likely to be tired, shaky, and weak and have muscle aches and abdominal pain. A fever may mean the infection is in your kidneys. Other symptoms of a kidney infection include pain in your back or sides below the ribs, nausea, and vomiting. DIAGNOSIS To diagnose a UTI, your caregiver will ask you about your symptoms. Your caregiver also will ask to provide a urine sample. The urine sample will be tested for bacteria and white blood cells. White blood cells are made by your body to help fight infection. TREATMENT  Typically, UTIs can be treated with medication. Because most UTIs are caused by a bacterial infection, they usually can be treated with the use of antibiotics. The choice of antibiotic and length of treatment depend on your symptoms and the type of bacteria causing your infection. HOME CARE INSTRUCTIONS  If you were  prescribed antibiotics, take them exactly as your caregiver instructs you. Finish the medication even if you feel better after you have only taken some of the medication.  Drink enough water and fluids to keep your urine clear or pale yellow.  Avoid caffeine, tea, and carbonated beverages. They tend to irritate your bladder.  Empty your bladder often. Avoid holding urine for long periods of time.  Empty your bladder before and after sexual intercourse.  After a bowel movement, women should cleanse from front to back. Use each tissue only once. SEEK MEDICAL CARE IF:   You have back pain.  You develop a fever.  Your symptoms do not begin to resolve within 3 days. SEEK IMMEDIATE MEDICAL CARE IF:   You have severe back pain or lower abdominal pain.  You develop chills.  You have nausea or vomiting.  You have continued burning or discomfort with urination. MAKE SURE YOU:   Understand these instructions.  Will watch your condition.  Will get help right away if you are not doing well or get worse. Document Released: 09/30/2004 Document Revised: 06/22/2011 Document Reviewed: 01/29/2011 [facility] Patient Information 2015 [place], [place]. This information is not intended to replace advice given to you by your health care provider. Make sure you discuss any questions you have with your health care provider.

## 2013-07-03 NOTE — Progress Notes (Signed)
Julia Wagner 1970/12/17 [id number]        43 y.o.  G2P2 presents with several day history of urinary frequency and mild dysuria. Also with suprapubic pressure. No back pain, fever chills nausea vomiting. Slight vaginal odor with discharge. No itching.  Past medical history,surgical history, problem list, medications, allergies, family history and social history were all reviewed and documented in the EPIC chart.  Directed ROS with pertinent positives and negatives documented in the history of present illness/assessment and plan.  Exam: Kim assistant General appearance  Normal Spine: Straight without deformity or CVA tenderness Abdomen: Soft nontender without masses guarding rebound Pelvic: External BUS vagina with slight white discharge. Cervix normal. IUD string visualized with colposcopy within external cervical os. Uterus normal size midline mobile nontender. Adnexa without masses or tenderness.  Assessment/Plan:  43 y.o. G2P2 symptoms, urinalysis consistent with UTI. Treat with Septra DS 1 by mouth twice a day x3 days. Wet prep consistent with yeast. Treat with Diflucan 150 mg x1 dose. Followup if symptoms persist, worsen or recur.   Note: This document was prepared with digital dictation and possible smart phrase technology. Any transcriptional errors that result from this process are unintentional.   Anastasio Auerbach MD, 10:17 AM 07/03/2013

## 2013-07-05 ENCOUNTER — Telehealth: Payer: Self-pay | Admitting: Obstetrics and Gynecology

## 2013-07-05 MED ORDER — CIPROFLOXACIN HCL 500 MG PO TABS: 500.0000 mg | ORAL_TABLET | Freq: Two times a day (BID) | ORAL | Status: DC

## 2013-07-05 NOTE — Telephone Encounter (Signed)
Patient calling after hours for UTI symptoms.   Seen in the office on 07/03/13 and was diagnosed and treated for a UTI.  States she has been treated with Bactrim for 3 days and that her symptoms were initially improved, but have now worsened this evening.  Has no further antibiotics.  Some back pain, but has been working in the yard.  No fever.   Urine culture showing Gram neg rods > 100,000 colonies - sensitivities pending.   Incompletely treated UTI. - Start Ciprofloxacin 500 mg po bid for 5 days.  - Go to urgent care or emergency department if worsens during the holiday weekend.

## 2013-07-06 LAB — URINE CULTURE: Colony Count: >=100000

## 2013-07-12 ENCOUNTER — Telehealth: Payer: Self-pay | Admitting: *Deleted

## 2013-07-12 DIAGNOSIS — N390 Urinary tract infection, site not specified: Secondary | ICD-10-CM

## 2013-07-12 NOTE — Telephone Encounter (Signed)
Yes that's right answer.

## 2013-07-12 NOTE — Telephone Encounter (Signed)
Pt was treated for UTI on 07/03/13 Septra DS 1 by mouth twice a day x3 days and then on 07/05/13 by Dr.Sliva who was on call for "Ciprofloxacin 500 mg po bid for 5 days". Pt still having some lower back discomfort, pt is going to stop by to leave u/a if okay?

## 2013-07-12 NOTE — Telephone Encounter (Signed)
Pt informed, will come tomorrow. Order placed

## 2013-07-13 ENCOUNTER — Ambulatory Visit: Payer: Managed Care, Other (non HMO)

## 2013-07-13 DIAGNOSIS — N390 Urinary tract infection, site not specified: Secondary | ICD-10-CM

## 2013-07-14 LAB — URINALYSIS W MICROSCOPIC + REFLEX CULTURE
Bilirubin Urine: NEGATIVE
Casts: NONE SEEN
Crystals: NONE SEEN
Glucose, UA: NEGATIVE mg/dL
Hgb urine dipstick: NEGATIVE
Ketones, ur: NEGATIVE mg/dL
Nitrite: NEGATIVE
Protein, ur: NEGATIVE mg/dL
Specific Gravity, Urine: 1.024 (ref 1.005–1.030)
Urobilinogen, UA: 0.2 mg/dL (ref 0.0–1.0)
pH: 6 (ref 5.0–8.0)

## 2013-07-15 LAB — URINE CULTURE: Colony Count: >=100000

## 2013-07-16 ENCOUNTER — Telehealth: Payer: Self-pay

## 2013-07-16 ENCOUNTER — Other Ambulatory Visit: Payer: Self-pay | Admitting: Gynecology

## 2013-07-16 MED ORDER — AMPICILLIN 500 MG PO CAPS: 500.0000 mg | ORAL_CAPSULE | Freq: Four times a day (QID) | ORAL | Status: DC

## 2013-07-16 NOTE — Telephone Encounter (Signed)
error 

## 2013-09-25 ENCOUNTER — Other Ambulatory Visit: Payer: Self-pay

## 2013-09-25 DIAGNOSIS — Z1231 Encounter for screening mammogram for malignant neoplasm of breast: Secondary | ICD-10-CM

## 2013-10-09 ENCOUNTER — Ambulatory Visit: Payer: BC Managed Care – PPO

## 2013-10-10 ENCOUNTER — Ambulatory Visit
Admission: RE | Admit: 2013-10-10 | Discharge: 2013-10-10 | Disposition: A | Discharge status: Home / Self Care | Payer: Managed Care, Other (non HMO) | Source: Ambulatory Visit

## 2013-10-10 DIAGNOSIS — Z1231 Encounter for screening mammogram for malignant neoplasm of breast: Secondary | ICD-10-CM

## 2013-10-18 ENCOUNTER — Encounter: Payer: Self-pay | Admitting: Gynecology

## 2013-10-18 ENCOUNTER — Other Ambulatory Visit (HOSPITAL_COMMUNITY)
Admission: RE | Admit: 2013-10-18 | Discharge: 2013-10-18 | Disposition: A | Discharge status: Home / Self Care | Payer: Managed Care, Other (non HMO) | Source: Ambulatory Visit | Attending: Gynecology | Admitting: Gynecology

## 2013-10-18 ENCOUNTER — Ambulatory Visit (INDEPENDENT_AMBULATORY_CARE_PROVIDER_SITE_OTHER): Payer: Managed Care, Other (non HMO) | Admitting: Gynecology

## 2013-10-18 VITALS — BP 120/84 | Ht 63.0 in | Wt 144.0 lb

## 2013-10-18 DIAGNOSIS — T8332XA Displacement of intrauterine contraceptive device, initial encounter: Secondary | ICD-10-CM

## 2013-10-18 DIAGNOSIS — T8389XA Other specified complication of genitourinary prosthetic devices, implants and grafts, initial encounter: Secondary | ICD-10-CM

## 2013-10-18 DIAGNOSIS — E038 Other specified hypothyroidism: Secondary | ICD-10-CM

## 2013-10-18 DIAGNOSIS — Z1151 Encounter for screening for human papillomavirus (HPV): Secondary | ICD-10-CM | POA: Diagnosis present

## 2013-10-18 DIAGNOSIS — Z23 Encounter for immunization: Secondary | ICD-10-CM

## 2013-10-18 DIAGNOSIS — Z01419 Encounter for gynecological examination (general) (routine) without abnormal findings: Secondary | ICD-10-CM | POA: Diagnosis present

## 2013-10-18 DIAGNOSIS — R635 Abnormal weight gain: Secondary | ICD-10-CM

## 2013-10-18 LAB — THYROID PANEL WITH TSH
Free Thyroxine Index: 2 (ref 1.4–3.8)
T3 Uptake: 28 % (ref 22.0–35.0)
T4, Total: 7.1 ug/dL (ref 4.5–12.0)
TSH: 3.143 u[IU]/mL (ref 0.350–4.500)

## 2013-10-18 LAB — COMPREHENSIVE METABOLIC PANEL
ALT: <8 U/L (ref 0–35)
AST: 16 U/L (ref 0–37)
Albumin: 4.3 g/dL (ref 3.5–5.2)
Alkaline Phosphatase: 70 U/L (ref 39–117)
BUN: 10 mg/dL (ref 6–23)
CO2: 26 mEq/L (ref 19–32)
Calcium: 9.3 mg/dL (ref 8.4–10.5)
Chloride: 103 mEq/L (ref 96–112)
Creat: 0.59 mg/dL (ref 0.50–1.10)
Glucose, Bld: 82 mg/dL (ref 70–99)
Potassium: 4.7 mEq/L (ref 3.5–5.3)
Sodium: 136 mEq/L (ref 135–145)
Total Bilirubin: 2.4 mg/dL — ABNORMAL HIGH (ref 0.2–1.2)
Total Protein: 6.7 g/dL (ref 6.0–8.3)

## 2013-10-18 LAB — CBC WITH DIFFERENTIAL/PLATELET
Basophils Absolute: 0.1 10*3/uL (ref 0.0–0.1)
Basophils Relative: 1 % (ref 0–1)
Eosinophils Absolute: 0.1 10*3/uL (ref 0.0–0.7)
Eosinophils Relative: 1 % (ref 0–5)
HCT: 39.2 % (ref 36.0–46.0)
Hemoglobin: 13.4 g/dL (ref 12.0–15.0)
Lymphocytes Relative: 26 % (ref 12–46)
Lymphs Abs: 2.1 10*3/uL (ref 0.7–4.0)
MCH: 31.1 pg (ref 26.0–34.0)
MCHC: 34.2 g/dL (ref 30.0–36.0)
MCV: 91 fL (ref 78.0–100.0)
Monocytes Absolute: 0.6 10*3/uL (ref 0.1–1.0)
Monocytes Relative: 8 % (ref 3–12)
Neutro Abs: 5.1 10*3/uL (ref 1.7–7.7)
Neutrophils Relative %: 64 % (ref 43–77)
Platelets: 268 10*3/uL (ref 150–400)
RBC: 4.31 MIL/uL (ref 3.87–5.11)
RDW: 13.3 % (ref 11.5–15.5)
WBC: 7.9 10*3/uL (ref 4.0–10.5)

## 2013-10-18 LAB — LIPID PANEL
Cholesterol: 147 mg/dL (ref 0–200)
HDL: 48 mg/dL (ref >39)
LDL Cholesterol: 83 mg/dL (ref 0–99)
Total CHOL/HDL Ratio: 3.1 Ratio
Triglycerides: 79 mg/dL (ref <150)
VLDL: 16 mg/dL (ref 0–40)

## 2013-10-18 NOTE — Addendum Note (Signed)
Addended by: Burnett Kanaris on: 10/18/2013 01:04 PM   Modules accepted: Orders

## 2013-10-18 NOTE — Addendum Note (Signed)
Addended by: Burnett Kanaris on: 10/18/2013 12:20 PM   Modules accepted: Orders

## 2013-10-18 NOTE — Patient Instructions (Signed)

## 2013-10-18 NOTE — Progress Notes (Signed)
Julia Wagner 1970/11/24 [id number]   History:    43 y.o.  for annual gyn exam with no complaints today. Patient had a Mirena IUD placed in February 2015. She is doing well with no menstrual cycles reported. Patient had a resectoscopic myomectomy in September of 2014 and has had no further bleeding issues. Patient with no past history of abnormal Pap smears. Mammogram is up-to-date. Patient does her monthly breast exam. Patient with history of hypothyroidism currently on 50 mcg of Synthroid. Patient has gained 10 pounds is last year. Patient declined flu vaccine. Patient has not had her Tdap vaccine.  Past medical history,surgical history, family history and social history were all reviewed and documented in the EPIC chart.  Gynecologic History No LMP recorded. Patient is not currently having periods (Reason: IUD). Contraception: IUD Last Pap: 2012. Results were: normal Last mammogram: 2015. Results were: normal  Obstetric History OB History  Gravida Para Term Preterm AB SAB TAB Ectopic Multiple Living  2 2        2     # Outcome Date GA Lbr Len/2nd Weight Sex Delivery Anes PTL Lv  2 PAR           1 PAR                ROS: A ROS was performed and pertinent positives and negatives are included in the history.  GENERAL: No fevers or chills. HEENT: No change in vision, no earache, sore throat or sinus congestion. NECK: No pain or stiffness. CARDIOVASCULAR: No chest pain or pressure. No palpitations. PULMONARY: No shortness of breath, cough or wheeze. GASTROINTESTINAL: No abdominal pain, nausea, vomiting or diarrhea, melena or bright red blood per rectum. GENITOURINARY: No urinary frequency, urgency, hesitancy or dysuria. MUSCULOSKELETAL: No joint or muscle pain, no back pain, no recent trauma. DERMATOLOGIC: No rash, no itching, no lesions. ENDOCRINE: No polyuria, polydipsia, no heat or cold intolerance. No recent change in weight. HEMATOLOGICAL: No anemia or easy bruising or bleeding.  NEUROLOGIC: No headache, seizures, numbness, tingling or weakness. PSYCHIATRIC: No depression, no loss of interest in normal activity or change in sleep pattern.     Exam: chaperone present  BP 120/84  Ht 5\' 3"  (1.6 m)  Wt 144 lb (65.318 kg)  BMI 25.51 kg/m2  Body mass index is 25.51 kg/(m^2).  General appearance : Well developed well nourished female. No acute distress HEENT: Neck supple, trachea midline, no carotid bruits, no thyroidmegaly Lungs: Clear to auscultation, no rhonchi or wheezes, or rib retractions  Heart: Regular rate and rhythm, no murmurs or gallops Breast:Examined in sitting and supine position were symmetrical in appearance, no palpable masses or tenderness,  no skin retraction, no nipple inversion, no nipple discharge, no skin discoloration, no axillary or supraclavicular lymphadenopathy Abdomen: no palpable masses or tenderness, no rebound or guarding Extremities: no edema or skin discoloration or tenderness  Pelvic:  Bartholin, Urethra, Skene Glands: Within normal limits             Vagina: No gross lesions or discharge  Cervix: IUD string not seen  Uterus  anteverted, normal size, shape and consistency, non-tender and mobile  Adnexa  Without masses or tenderness  Anus and perineum  normal   Rectovaginal  normal sphincter tone without palpated masses or tenderness             Hemoccult not indicated     Assessment/Plan:  43 y.o. female for annual exam doing well since her resectoscopic myomectomy in 2014.  Patient doing well since Mirena IUD was placed in February 2015. Patient will return back to the office in 1-2 weeks for pelvic ultrasound to confirm position of IUD. The following labs were ordered today: CBC, fasting lipid profile, comprehensive metabolic panel, TSH, urinalysis and Pap smear. Patient did receive the Tdap vaccine today.   Terrance Mass MD, 11:35 AM 10/18/2013

## 2013-10-19 ENCOUNTER — Other Ambulatory Visit: Payer: Self-pay | Admitting: Gynecology

## 2013-10-19 DIAGNOSIS — R17 Unspecified jaundice: Secondary | ICD-10-CM

## 2013-10-19 LAB — URINALYSIS W MICROSCOPIC + REFLEX CULTURE
Bilirubin Urine: NEGATIVE
Casts: NONE SEEN
Crystals: NONE SEEN
Glucose, UA: NEGATIVE mg/dL
Hgb urine dipstick: NEGATIVE
Ketones, ur: NEGATIVE mg/dL
Nitrite: NEGATIVE
Protein, ur: NEGATIVE mg/dL
Specific Gravity, Urine: 1.023 (ref 1.005–1.030)
Urobilinogen, UA: 0.2 mg/dL (ref 0.0–1.0)
pH: 6 (ref 5.0–8.0)

## 2013-10-21 LAB — URINE CULTURE: Colony Count: >=100000

## 2013-10-22 ENCOUNTER — Other Ambulatory Visit: Payer: Self-pay | Admitting: Gynecology

## 2013-10-22 LAB — CYTOLOGY - PAP

## 2013-10-22 MED ORDER — NITROFURANTOIN MONOHYD MACRO 100 MG PO CAPS: 100.0000 mg | ORAL_CAPSULE | Freq: Two times a day (BID) | ORAL | Status: DC

## 2013-10-23 ENCOUNTER — Other Ambulatory Visit: Payer: Self-pay | Admitting: Gynecology

## 2013-10-23 MED ORDER — NITROFURANTOIN MONOHYD MACRO 100 MG PO CAPS: 100.0000 mg | ORAL_CAPSULE | Freq: Two times a day (BID) | ORAL | Status: DC

## 2013-10-25 ENCOUNTER — Other Ambulatory Visit: Payer: Managed Care, Other (non HMO)

## 2013-10-25 ENCOUNTER — Other Ambulatory Visit: Payer: Self-pay | Admitting: Gynecology

## 2013-10-25 DIAGNOSIS — R17 Unspecified jaundice: Secondary | ICD-10-CM

## 2013-10-25 LAB — BILIRUBIN, TOTAL: Total Bilirubin: 2.5 mg/dL — ABNORMAL HIGH (ref 0.2–1.2)

## 2013-10-25 LAB — BILIRUBIN,DIRECT & INDIRECT (FRACTIONATED)
Bilirubin, Direct: 0.4 mg/dL — ABNORMAL HIGH (ref 0.0–0.3)
Indirect Bilirubin: 2.1 mg/dL — ABNORMAL HIGH (ref 0.2–1.2)

## 2013-10-25 MED ORDER — SULFAMETHOXAZOLE-TMP DS 800-160 MG PO TABS: 1.0000 | ORAL_TABLET | Freq: Two times a day (BID) | ORAL | Status: DC

## 2013-10-30 ENCOUNTER — Telehealth: Payer: Self-pay | Admitting: *Deleted

## 2013-10-30 DIAGNOSIS — R7989 Other specified abnormal findings of blood chemistry: Secondary | ICD-10-CM

## 2013-10-30 NOTE — Telephone Encounter (Signed)
Pt informed with the below note. 

## 2013-10-30 NOTE — Telephone Encounter (Signed)
Per Dr.Fernandez "Please inform patient that her liver function tests (bilirubin) continues to be elevated and I would like to refer her to Dr.Gessner gastroenterologist for further evaluation" and order an upper abdominal ultrasound prior to that appointment    Appointment for ultrasound 11/01/13 @c  7:30 am pt will need to be NPO after midnight. GI office will contact pt to schedule. Left message for pt to call

## 2013-10-31 ENCOUNTER — Encounter: Payer: Self-pay | Admitting: Gastroenterology

## 2013-11-01 ENCOUNTER — Ambulatory Visit (HOSPITAL_COMMUNITY)
Admission: RE | Admit: 2013-11-01 | Discharge: 2013-11-01 | Disposition: A | Discharge status: Home / Self Care | Payer: Managed Care, Other (non HMO) | Source: Ambulatory Visit | Attending: Gynecology | Admitting: Gynecology

## 2013-11-01 DIAGNOSIS — R7989 Other specified abnormal findings of blood chemistry: Secondary | ICD-10-CM | POA: Diagnosis not present

## 2013-11-01 DIAGNOSIS — N200 Calculus of kidney: Secondary | ICD-10-CM | POA: Insufficient documentation

## 2013-11-02 ENCOUNTER — Ambulatory Visit (INDEPENDENT_AMBULATORY_CARE_PROVIDER_SITE_OTHER): Payer: Managed Care, Other (non HMO)

## 2013-11-02 ENCOUNTER — Ambulatory Visit (INDEPENDENT_AMBULATORY_CARE_PROVIDER_SITE_OTHER): Payer: Managed Care, Other (non HMO) | Admitting: Gynecology

## 2013-11-02 ENCOUNTER — Other Ambulatory Visit: Payer: Self-pay | Admitting: Gynecology

## 2013-11-02 DIAGNOSIS — N8320 Unspecified ovarian cysts: Secondary | ICD-10-CM

## 2013-11-02 DIAGNOSIS — T8389XA Other specified complication of genitourinary prosthetic devices, implants and grafts, initial encounter: Secondary | ICD-10-CM

## 2013-11-02 DIAGNOSIS — N83201 Unspecified ovarian cyst, right side: Secondary | ICD-10-CM

## 2013-11-02 DIAGNOSIS — Z30431 Encounter for routine checking of intrauterine contraceptive device: Secondary | ICD-10-CM

## 2013-11-02 NOTE — Progress Notes (Signed)
   Patient presented to the office today to discuss her ultrasound. Patient was seen in the office for annual gynecological exam on October 15 and the IUD string was not seen and this is the reason for today's office visit. Patient had laboratory done that office visit and it was noted that her total bilirubin was elevated at 2.4. She returned back on October 22 were repeat bilirubin test was ordered as follows:  Results for Julia Wagner, Julia Wagner (MRN [id number]) as of 11/02/2013 12:04  Ref. Range 10/25/2013 11:14  Bilirubin, Direct Latest Range: 0.0-0.3 mg/dL 0.4 (H)  Indirect Bilirubin Latest Range: 0.2-1.2 mg/dL 2.1 (H)  Total Bilirubin Latest Range: 0.2-1.2 mg/dL 2.5 (H)    As a result of these findings and ultrasound was ordered with the following result: IMPRESSION:  Negative. No evidence of gallstones, hydronephrosis, or other  sonographic abnormality.   Pelvic ultrasound today: Uterus measured 9.6 x 6.1 x 4.3 cm with endometrial stripe of 3.3 mm. IUD was seen in the proper position in the uterine cavity. A right ovarian thinwall echo-free avascular cyst measuring 3.6 x 2.0 x 3.2 cm was noted negative color flow. Left ovary normal. No fluid cul-de-sac.  Assessment/plan: #1 simple right ovarian cyst with no suspicious features. This probably result of the progesterone from the [place] IUD. A CA-125 will be drawn today. Patient will return back to the office in 3 months for follow-up ultrasound. #2 recent annual labs indicating abnormally elevated total bilirubin, direct and indirect bilirubin was normal abdominal ultrasound. Patient was referred to my colleague Dr. Deatra Ina gastroenterologist for further evaluation which she will see in the next few weeks.

## 2013-11-02 NOTE — Patient Instructions (Signed)
CA-125 Tumor Marker CA 125 is a tumor marker that is used to help monitor the course of ovarian or endometrial cancer. PREPARATION FOR TEST No preparation is necessary. NORMAL FINDINGS Adults: 0-35 units/mL (0-35 kilounits)/L Ranges for normal findings may vary among different laboratories and hospitals. You should always check with your doctor after having lab work or other tests done to discuss the meaning of your test results and whether your values are considered within normal limits. MEANING OF TEST  Your caregiver will go over the test results with you and discuss the importance and meaning of your results, as well as treatment options and the need for additional tests if necessary. OBTAINING THE TEST RESULTS It is your responsibility to obtain your test results. Ask the lab or department performing the test when and how you will get your results. Document Released: 01/13/2004 Document Revised: 03/15/2011 Document Reviewed: 11/29/2007 [employer] Patient Information 2015 [employer]. This information is not intended to replace advice given to you by your health care provider. Make sure you discuss any questions you have with your health care provider. Ovarian Cyst An ovarian cyst is a fluid-filled sac that forms on an ovary. The ovaries are small organs that produce eggs in women. Various types of cysts can form on the ovaries. Most are not cancerous. Many do not cause problems, and they often go away on their own. Some may cause symptoms and require treatment. Common types of ovarian cysts include:  Functional cysts--These cysts may occur every month during the menstrual cycle. This is normal. The cysts usually go away with the next menstrual cycle if the woman does not get pregnant. Usually, there are no symptoms with a functional cyst.  Endometrioma cysts--These cysts form from the tissue that lines the uterus. They are also called "chocolate cysts" because they become filled with blood  that turns brown. This type of cyst can cause pain in the lower abdomen during intercourse and with your menstrual period.  Cystadenoma cysts--This type develops from the cells on the outside of the ovary. These cysts can get very big and cause lower abdomen pain and pain with intercourse. This type of cyst can twist on itself, cut off its blood supply, and cause severe pain. It can also easily rupture and cause a lot of pain.  Dermoid cysts--This type of cyst is sometimes found in both ovaries. These cysts may contain different kinds of body tissue, such as skin, teeth, hair, or cartilage. They usually do not cause symptoms unless they get very big.  Theca lutein cysts--These cysts occur when too much of a certain hormone (human chorionic gonadotropin) is produced and overstimulates the ovaries to produce an egg. This is most common after procedures used to assist with the conception of a baby (in vitro fertilization). CAUSES   Fertility drugs can cause a condition in which multiple large cysts are formed on the ovaries. This is called ovarian hyperstimulation syndrome.  A condition called polycystic ovary syndrome can cause hormonal imbalances that can lead to nonfunctional ovarian cysts. SIGNS AND SYMPTOMS  Many ovarian cysts do not cause symptoms. If symptoms are present, they may include:  Pelvic pain or pressure.  Pain in the lower abdomen.  Pain during sexual intercourse.  Increasing girth (swelling) of the abdomen.  Abnormal menstrual periods.  Increasing pain with menstrual periods.  Stopping having menstrual periods without being pregnant. DIAGNOSIS  These cysts are commonly found during a routine or annual pelvic exam. Tests may be ordered to find   out more about the cyst. These tests may include:  Ultrasound.  X-ray of the pelvis.  CT scan.  MRI.  Blood tests. TREATMENT  Many ovarian cysts go away on their own without treatment. Your health care provider may want  to check your cyst regularly for 2-3 months to see if it changes. For women in menopause, it is particularly important to monitor a cyst closely because of the higher rate of ovarian cancer in menopausal women. When treatment is needed, it may include any of the following:  A procedure to drain the cyst (aspiration). This may be done using a long needle and ultrasound. It can also be done through a laparoscopic procedure. This involves using a thin, lighted tube with a tiny camera on the end (laparoscope) inserted through a small incision.  Surgery to remove the whole cyst. This may be done using laparoscopic surgery or an open surgery involving a larger incision in the lower abdomen.  Hormone treatment or birth control pills. These methods are sometimes used to help dissolve a cyst. HOME CARE INSTRUCTIONS   Only take over-the-counter or prescription medicines as directed by your health care provider.  Follow up with your health care provider as directed.  Get regular pelvic exams and Pap tests. SEEK MEDICAL CARE IF:   Your periods are late, irregular, or painful, or they stop.  Your pelvic pain or abdominal pain does not go away.  Your abdomen becomes larger or swollen.  You have pressure on your bladder or trouble emptying your bladder completely.  You have pain during sexual intercourse.  You have feelings of fullness, pressure, or discomfort in your stomach.  You lose weight for no apparent reason.  You feel generally ill.  You become constipated.  You lose your appetite.  You develop acne.  You have an increase in body and facial hair.  You are gaining weight, without changing your exercise and eating habits.  You think you are pregnant. SEEK IMMEDIATE MEDICAL CARE IF:   You have increasing abdominal pain.  You feel sick to your stomach (nauseous), and you throw up (vomit).  You develop a fever that comes on suddenly.  You have abdominal pain during a bowel  movement.  Your menstrual periods become heavier than usual. MAKE SURE YOU:  Understand these instructions.  Will watch your condition.  Will get help right away if you are not doing well or get worse. Document Released: 12/21/2004 Document Revised: 12/26/2012 Document Reviewed: 08/28/2012 [employer] Patient Information 2015 [employer]. This information is not intended to replace advice given to you by your health care provider. Make sure you discuss any questions you have with your health care provider.  

## 2013-11-03 LAB — CA 125: CA 125: 22 U/mL (ref <35)

## 2013-11-05 ENCOUNTER — Encounter: Payer: Self-pay | Admitting: Gynecology

## 2013-11-06 ENCOUNTER — Encounter: Payer: Self-pay | Admitting: Gastroenterology

## 2013-11-06 ENCOUNTER — Ambulatory Visit (INDEPENDENT_AMBULATORY_CARE_PROVIDER_SITE_OTHER): Payer: Managed Care, Other (non HMO) | Admitting: Gastroenterology

## 2013-11-06 VITALS — BP 120/64 | HR 64 | Ht 62.25 in | Wt 143.4 lb

## 2013-11-06 DIAGNOSIS — R945 Abnormal results of liver function studies: Secondary | ICD-10-CM

## 2013-11-06 NOTE — Progress Notes (Signed)
      History of Present Illness:  Julia Wagner return for abnormal liver tests.recent bilirubin was 2.5 with indirect 2.1.abdominal ultrasound was normal.  She has no GI complaints.    Review of Systems: Pertinent positive and negative review of systems were noted in the above HPI section. All other review of systems were otherwise negative.    Current Medications, Allergies, Past Medical History, Past Surgical History, Family History and Social History were reviewed in [facility] record  Vital signs were reviewed in today's medical record. Physical Exam: General: Well developed , well nourished, no acute distress Skin: anicteric Head: Normocephalic and atraumatic Eyes:  sclerae anicteric, EOMI Ears: Normal auditory acuity Mouth: No deformity or lesions Lungs: Clear throughout to auscultation Heart: Regular rate and rhythm; no murmurs, rubs or bruits Abdomen: Soft, non tender and non distended. No masses, hepatosplenomegaly or hernias noted. Normal Bowel sounds Rectal:deferred Musculoskeletal: Symmetrical with no gross deformities  Pulses:  Normal pulses noted Extremities: No clubbing, cyanosis, edema or deformities noted Neurological: Alert oriented x 4, grossly nonfocal Psychological:  Alert and cooperative. Normal mood and affect  See Assessment and Plan under Problem List

## 2013-11-06 NOTE — Patient Instructions (Signed)
Follow up as needed

## 2013-11-06 NOTE — Assessment & Plan Note (Signed)
Patient has Gilbert's syndrome.  No further workup is required.  findings were explained to the patient

## 2014-01-11 ENCOUNTER — Ambulatory Visit (INDEPENDENT_AMBULATORY_CARE_PROVIDER_SITE_OTHER): Payer: BLUE CROSS/BLUE SHIELD | Admitting: Gynecology

## 2014-01-11 ENCOUNTER — Ambulatory Visit (INDEPENDENT_AMBULATORY_CARE_PROVIDER_SITE_OTHER): Payer: BLUE CROSS/BLUE SHIELD

## 2014-01-11 DIAGNOSIS — N8320 Unspecified ovarian cysts: Secondary | ICD-10-CM

## 2014-01-11 DIAGNOSIS — N83201 Unspecified ovarian cyst, right side: Secondary | ICD-10-CM

## 2014-01-11 DIAGNOSIS — M5489 Other dorsalgia: Secondary | ICD-10-CM

## 2014-01-11 DIAGNOSIS — N83202 Unspecified ovarian cyst, left side: Secondary | ICD-10-CM | POA: Insufficient documentation

## 2014-01-11 NOTE — Progress Notes (Signed)
   Patient presented to the office today to discuss her ultrasound. At time of her annual exam on October 15 the IUD string was not seen and an ultrasound had been done at that time which demonstrated the following:  Pelvic ultrasound today: Uterus measured 9.6 x 6.1 x 4.3 cm with endometrial stripe of 3.3 mm. IUD was seen in the proper position in the uterine cavity. A right ovarian thinwall echo-free avascular cyst measuring 3.6 x 2.0 x 3.2 cm was noted negative color flow. Left ovary normal. No fluid cul-de-sac.  Patient been complaining of some low back discomfort during time of exercise but denied any dysuria or frequency.  Exam: There was no CVA tenderness discomfort is that the lumbosacral region after she exercises. We will check a urinalysis today.  Ultrasound today: Uterus measured 9.7 x 5.9 x 4.57 m with a meter stripe of 3.3 mm. IUD string was seen in the endometrial cavity bilateral ovarian cyst was noted the right ovary had an echo-free thinwall avascular cyst with Ace thin septum measuring 20.5 x 2.1 x 1.6 cm and the left ovary had an echo-free thinwall slightly vascular cyst measuring 3.7 x 3.4 x 2.6 and meters no fluid in the cul-de-sac  Assessment/plan: Patient with bilateral ovarian cyst it appears previously seen right ovarian cyst has decreased in size and one on the left appears to be a hemorrhagic cyst. Patient on October 2015 had a normal CA 125 with a value of 22 we are going to repeated today. Patient will return back in 3 months for follow-up ultrasound. Literature information on ultrasound as well as a laparoscopy was provided. We discussed several options. If still the cyst is present we will proceed with laparoscopic ovarian cystectomy or remove the Mirena IUD and repeat ultrasound in 3 months with no hormonal effect to see if it's present at the cystoscopy present then and then proceed with laparoscopic cystectomy. Patient agrees and will follow accordingly her husband was  present.

## 2014-01-11 NOTE — Patient Instructions (Signed)
Diagnostic Laparoscopy Laparoscopy is a surgical procedure. It is used to diagnose and treat diseases inside the belly (abdomen). It is usually a brief, common, and relatively simple procedure. The laparoscopeis a thin, lighted, pencil-sized instrument. It is like a telescope. It is inserted into your abdomen through a small cut (incision). Your caregiver can look at the organs inside your body through this instrument. He or she can see if there is anything abnormal. Laparoscopy can be done either in a hospital or outpatient clinic. You may be given a mild sedative to help you relax before the procedure. Once in the operating room, you will be given a drug to make you sleep (general anesthesia). Laparoscopy usually lasts less than 1 hour. After the procedure, you will be monitored in a recovery area until you are stable and doing well. Once you are home, it will take 2 to 3 days to fully recover. RISKS AND COMPLICATIONS  Laparoscopy has relatively few risks. Your caregiver will discuss the risks with you before the procedure. Some problems that can occur include:  Infection.  Bleeding.  Damage to other organs.  Anesthetic side effects. PROCEDURE Once you receive anesthesia, your surgeon inflates the abdomen with a harmless gas (carbon dioxide). This makes the organs easier to see. The laparoscope is inserted into the abdomen through a small incision. This allows your surgeon to see into the abdomen. Other small instruments are also inserted into the abdomen through other small openings. Many surgeons attach a video camera to the laparoscope to enlarge the view. During a diagnostic laparoscopy, the surgeon may be looking for inflammation, infection, or cancer. Your surgeon may take tissue samples(biopsies). The samples are sent to a specialist in looking at cells and tissue samples (pathologist). The pathologist examines them under a microscope. Biopsies can help to diagnose or confirm a  disease. AFTER THE PROCEDURE   The gas is released from inside the abdomen.  The incisions are closed with stitches (sutures). Because these incisions are small (usually less than 1/2 inch), there is usually minimal discomfort after the procedure. There may be some mild discomfort in the throat. This is from the tube placed in the throat while you were sleeping. You may have some mild abdominal discomfort. There may also be discomfort from the instrument placement incisions in the abdomen.  The recovery time is shortened as long as there are no complications.  You will rest in a recovery room until stable and doing well. As long as there are no complications, you may be allowed to go home. FINDING OUT THE RESULTS OF YOUR TEST Not all test results are available during your visit. If your test results are not back during the visit, make an appointment with your caregiver to find out the results. Do not assume everything is normal if you have not heard from your caregiver or the medical facility. It is important for you to follow up on all of your test results. HOME CARE INSTRUCTIONS   Take all medicines as directed.  Only take over-the-counter or prescription medicines for pain, discomfort, or fever as directed by your caregiver.  Resume daily activities as directed.  Showers are preferred over baths.  You may resume sexual activities in 1 week or as directed.  Do not drive while taking narcotics. SEEK MEDICAL CARE IF:   There is increasing abdominal pain.  There is new pain in the shoulders (shoulder strap areas).  You feel lightheaded or faint.  You have the chills.  You or   your child has an oral temperature above 102 F (38.9 C).  There is pus-like (purulent) drainage from any of the wounds.  You are unable to pass gas or have a bowel movement.  You feel sick to your stomach (nauseous) or throw up (vomit). MAKE SURE YOU:   Understand these instructions.  Will watch  your condition.  Will get help right away if you are not doing well or get worse. Document Released: 03/29/2000 Document Revised: 04/17/2012 Document Reviewed: 12/21/2006 [facility] Patient Information 2015 [place], [place]. This information is not intended to replace advice given to you by your health care provider. Make sure you discuss any questions you have with your health care provider. CA-125 Tumor Marker CA 125 is a tumor marker that is used to help monitor the course of ovarian or endometrial cancer. PREPARATION FOR TEST No preparation is necessary. NORMAL FINDINGS Adults: 0-35 units/mL (0-35 kilounits)/L Ranges for normal findings may vary among different laboratories and hospitals. You should always check with your doctor after having lab work or other tests done to discuss the meaning of your test results and whether your values are considered within normal limits. MEANING OF TEST  Your caregiver will go over the test results with you and discuss the importance and meaning of your results, as well as treatment options and the need for additional tests if necessary. OBTAINING THE TEST RESULTS It is your responsibility to obtain your test results. Ask the lab or department performing the test when and how you will get your results. Document Released: 01/13/2004 Document Revised: 03/15/2011 Document Reviewed: 11/29/2007 [facility] Patient Information 2015 [place], [place]. This information is not intended to replace advice given to you by your health care provider. Make sure you discuss any questions you have with your health care provider. Ovarian Cyst An ovarian cyst is a fluid-filled sac that forms on an ovary. The ovaries are small organs that produce eggs in women. Various types of cysts can form on the ovaries. Most are not cancerous. Many do not cause problems, and they often go away on their own. Some may cause symptoms and require treatment. Common types of ovarian cysts  include:  Functional cysts--These cysts may occur every month during the menstrual cycle. This is normal. The cysts usually go away with the next menstrual cycle if the woman does not get pregnant. Usually, there are no symptoms with a functional cyst.  Endometrioma cysts--These cysts form from the tissue that lines the uterus. They are also called "chocolate cysts" because they become filled with blood that turns brown. This type of cyst can cause pain in the lower abdomen during intercourse and with your menstrual period.  Cystadenoma cysts--This type develops from the cells on the outside of the ovary. These cysts can get very big and cause lower abdomen pain and pain with intercourse. This type of cyst can twist on itself, cut off its blood supply, and cause severe pain. It can also easily rupture and cause a lot of pain.  Dermoid cysts--This type of cyst is sometimes found in both ovaries. These cysts may contain different kinds of body tissue, such as skin, teeth, hair, or cartilage. They usually do not cause symptoms unless they get very big.  Theca lutein cysts--These cysts occur when too much of a certain hormone (human chorionic gonadotropin) is produced and overstimulates the ovaries to produce an egg. This is most common after procedures used to assist with the conception of a baby (in vitro fertilization). CAUSES  Fertility drugs can cause a condition in which multiple large cysts are formed on the ovaries. This is called ovarian hyperstimulation syndrome.  A condition called polycystic ovary syndrome can cause hormonal imbalances that can lead to nonfunctional ovarian cysts. SIGNS AND SYMPTOMS  Many ovarian cysts do not cause symptoms. If symptoms are present, they may include:  Pelvic pain or pressure.  Pain in the lower abdomen.  Pain during sexual intercourse.  Increasing girth (swelling) of the abdomen.  Abnormal menstrual periods.  Increasing pain with menstrual  periods.  Stopping having menstrual periods without being pregnant. DIAGNOSIS  These cysts are commonly found during a routine or annual pelvic exam. Tests may be ordered to find out more about the cyst. These tests may include:  Ultrasound.  X-ray of the pelvis.  CT scan.  MRI.  Blood tests. TREATMENT  Many ovarian cysts go away on their own without treatment. Your health care provider may want to check your cyst regularly for 2-3 months to see if it changes. For women in menopause, it is particularly important to monitor a cyst closely because of the higher rate of ovarian cancer in menopausal women. When treatment is needed, it may include any of the following:  A procedure to drain the cyst (aspiration). This may be done using a long needle and ultrasound. It can also be done through a laparoscopic procedure. This involves using a thin, lighted tube with a tiny camera on the end (laparoscope) inserted through a small incision.  Surgery to remove the whole cyst. This may be done using laparoscopic surgery or an open surgery involving a larger incision in the lower abdomen.  Hormone treatment or birth control pills. These methods are sometimes used to help dissolve a cyst. HOME CARE INSTRUCTIONS   Only take over-the-counter or prescription medicines as directed by your health care provider.  Follow up with your health care provider as directed.  Get regular pelvic exams and Pap tests. SEEK MEDICAL CARE IF:   Your periods are late, irregular, or painful, or they stop.  Your pelvic pain or abdominal pain does not go away.  Your abdomen becomes larger or swollen.  You have pressure on your bladder or trouble emptying your bladder completely.  You have pain during sexual intercourse.  You have feelings of fullness, pressure, or discomfort in your stomach.  You lose weight for no apparent reason.  You feel generally ill.  You become constipated.  You lose your  appetite.  You develop acne.  You have an increase in body and facial hair.  You are gaining weight, without changing your exercise and eating habits.  You think you are pregnant. SEEK IMMEDIATE MEDICAL CARE IF:   You have increasing abdominal pain.  You feel sick to your stomach (nauseous), and you throw up (vomit).  You develop a fever that comes on suddenly.  You have abdominal pain during a bowel movement.  Your menstrual periods become heavier than usual. MAKE SURE YOU:  Understand these instructions.  Will watch your condition.  Will get help right away if you are not doing well or get worse. Document Released: 12/21/2004 Document Revised: 12/26/2012 Document Reviewed: 08/28/2012 [facility] Patient Information 2015 [place], [place]. This information is not intended to replace advice given to you by your health care provider. Make sure you discuss any questions you have with your health care provider.

## 2014-01-12 LAB — URINALYSIS W MICROSCOPIC + REFLEX CULTURE
Bacteria, UA: NONE SEEN
Bilirubin Urine: NEGATIVE
Casts: NONE SEEN
Crystals: NONE SEEN
Glucose, UA: NEGATIVE mg/dL
Hgb urine dipstick: NEGATIVE
Ketones, ur: NEGATIVE mg/dL
Leukocytes, UA: NEGATIVE
Nitrite: NEGATIVE
Protein, ur: NEGATIVE mg/dL
Specific Gravity, Urine: 1.021 (ref 1.005–1.030)
Urobilinogen, UA: 0.2 mg/dL (ref 0.0–1.0)
pH: 7.5 (ref 5.0–8.0)

## 2014-01-12 LAB — CA 125: CA 125: 14 U/mL (ref <35)

## 2014-02-05 ENCOUNTER — Other Ambulatory Visit: Payer: Self-pay | Admitting: Gynecology

## 2014-04-12 ENCOUNTER — Other Ambulatory Visit: Payer: BLUE CROSS/BLUE SHIELD

## 2014-04-12 ENCOUNTER — Ambulatory Visit: Payer: BLUE CROSS/BLUE SHIELD | Admitting: Gynecology

## 2014-05-03 ENCOUNTER — Ambulatory Visit: Payer: BLUE CROSS/BLUE SHIELD | Admitting: Gynecology

## 2014-05-03 ENCOUNTER — Other Ambulatory Visit: Payer: BLUE CROSS/BLUE SHIELD

## 2014-09-15 ENCOUNTER — Encounter: Payer: Self-pay | Admitting: Emergency Medicine

## 2014-09-15 ENCOUNTER — Ambulatory Visit
Admission: EM | Admit: 2014-09-15 | Discharge: 2014-09-15 | Disposition: A | Discharge status: Home / Self Care | Payer: BLUE CROSS/BLUE SHIELD | Attending: Family Medicine | Admitting: Family Medicine

## 2014-09-15 DIAGNOSIS — R109 Unspecified abdominal pain: Secondary | ICD-10-CM

## 2014-09-15 DIAGNOSIS — N390 Urinary tract infection, site not specified: Secondary | ICD-10-CM | POA: Diagnosis not present

## 2014-09-15 DIAGNOSIS — R350 Frequency of micturition: Secondary | ICD-10-CM | POA: Diagnosis not present

## 2014-09-15 DIAGNOSIS — IMO0001 Reserved for inherently not codable concepts without codable children: Secondary | ICD-10-CM

## 2014-09-15 LAB — URINALYSIS COMPLETE WITH MICROSCOPIC (ARMC ONLY)
Bilirubin Urine: NEGATIVE
Glucose, UA: NEGATIVE mg/dL
Hgb urine dipstick: NEGATIVE
Ketones, ur: NEGATIVE mg/dL
Leukocytes, UA: NEGATIVE
Nitrite: NEGATIVE
Protein, ur: NEGATIVE mg/dL
Specific Gravity, Urine: 1.025 (ref 1.005–1.030)
pH: 5 (ref 5.0–8.0)

## 2014-09-15 MED ORDER — NITROFURANTOIN MONOHYD MACRO 100 MG PO CAPS: 100.0000 mg | ORAL_CAPSULE | Freq: Two times a day (BID) | ORAL | Status: DC

## 2014-09-15 MED ORDER — PHENAZOPYRIDINE HCL 200 MG PO TABS: 200.0000 mg | ORAL_TABLET | Freq: Three times a day (TID) | ORAL | Status: DC | PRN

## 2014-09-15 NOTE — Discharge Instructions (Signed)
Urinary Tract Infection A urinary tract infection (UTI) can occur any place along the urinary tract. The tract includes the kidneys, ureters, bladder, and urethra. A type of germ called bacteria often causes a UTI. UTIs are often helped with antibiotic medicine.  HOME CARE   If given, take antibiotics as told by your doctor. Finish them even if you start to feel better.  Drink enough fluids to keep your pee (urine) clear or pale yellow.  Avoid tea, drinks with caffeine, and bubbly (carbonated) drinks.  Pee often. Avoid holding your pee in for a long time.  Pee before and after having sex (intercourse).  Wipe from front to back after you poop (bowel movement) if you are a woman. Use each tissue only once. GET HELP RIGHT AWAY IF:   You have back pain.  You have lower belly (abdominal) pain.  You have chills.  You feel sick to your stomach (nauseous).  You throw up (vomit).  Your burning or discomfort with peeing does not go away.  You have a fever.  Your symptoms are not better in 3 days. MAKE SURE YOU:   Understand these instructions.  Will watch your condition.  Will get help right away if you are not doing well or get worse. Document Released: 06/09/2007 Document Revised: 09/15/2011 Document Reviewed: 07/22/2011 [facility] Patient Information 2015 [place], [place]. This information is not intended to replace advice given to you by your health care provider. Make sure you discuss any questions you have with your health care provider.  Abdominal Pain Many things can cause belly (abdominal) pain. Most times, the belly pain is not dangerous. Many cases of belly pain can be watched and treated at home. HOME CARE   Do not take medicines that help you go poop (laxatives) unless told to by your doctor.  Only take medicine as told by your doctor.  Eat or drink as told by your doctor. Your doctor will tell you if you should be on a special diet. GET HELP IF:  You do not know  what is causing your belly pain.  You have belly pain while you are sick to your stomach (nauseous) or have runny poop (diarrhea).  You have pain while you pee or poop.  Your belly pain wakes you up at night.  You have belly pain that gets worse or better when you eat.  You have belly pain that gets worse when you eat fatty foods.  You have a fever. GET HELP RIGHT AWAY IF:   The pain does not go away within 2 hours.  You keep throwing up (vomiting).  The pain changes and is only in the right or left part of the belly.  You have bloody or tarry looking poop. MAKE SURE YOU:   Understand these instructions.  Will watch your condition.  Will get help right away if you are not doing well or get worse. Document Released: 06/09/2007 Document Revised: 12/26/2012 Document Reviewed: 08/30/2012 [facility] Patient Information 2015 [place], [place]. This information is not intended to replace advice given to you by your health care provider. Make sure you discuss any questions you have with your health care provider.  Flank Pain Flank pain is pain in your side. The flank is the area of your side between your upper belly (abdomen) and your back. Pain in this area can be caused by many different things. [place] care and treatment will depend on the cause of your pain.  Rest as told by your doctor.  Drink enough fluids to keep your pee (urine) clear or pale yellow.  Only take medicine as told by your doctor.  Tell your doctor about any changes in your pain.  Follow up with your doctor. GET HELP RIGHT AWAY IF:   Your pain does not get better with medicine.   You have new symptoms or your symptoms get worse.  Your pain gets worse.   You have belly (abdominal) pain.   You are short of breath.   You always feel sick to your stomach (nauseous).   You keep throwing up (vomiting).   You have puffiness (swelling) in your belly.   You feel light-headed or you pass  out (faint).   You have blood in your pee.  You have a fever or lasting symptoms for more than 2-3 days.  You have a fever and your symptoms suddenly get worse. MAKE SURE YOU:   Understand these instructions.  Will watch your condition.  Will get help right away if you are not doing well or get worse. Document Released: 09/30/2007 Document Revised: 05/07/2013 Document Reviewed: 08/05/2011 [facility] Patient Information 2015 [place], [place]. This information is not intended to replace advice given to you by your health care provider. Make sure you discuss any questions you have with your health care provider.

## 2014-09-15 NOTE — ED Provider Notes (Signed)
CSN: [id number]     Arrival date & time 09/15/14  1333 History   First MD Initiated Contact with Patient 09/15/14 1426     Chief Complaint  Patient presents with  . Back Pain  . Urinary Tract Infection   she states she was driving a lot yesterday started noticing discomfort frequency and low back pain. She states this is her usual UTI symptoms she has. Last UTI was either in January April. States that she finds when she drinks  diet Cokes her favorite that's when she starts having these UTIs. (Consider location/radiation/quality/duration/timing/severity/associated sxs/prior Treatment) Patient is a 44 y.o. female presenting with back pain and urinary tract infection. The history is provided by the patient. No language interpreter was used.  Back Pain Location:  Generalized Quality:  Aching Radiates to:  Does not radiate Chronicity:  New Context: not falling, not MCA, not physical stress and not recent illness   Relieved by:  Nothing Ineffective treatments:  None tried Associated symptoms: no abdominal pain, no abdominal swelling, no fever, no pelvic pain, no perianal numbness, no tingling and no weakness   Urinary Tract Infection Pain quality:  Aching Pain severity:  Moderate Onset quality:  Sudden Timing:  Constant Chronicity:  New Relieved by:  Nothing Ineffective treatments:  None tried Urinary symptoms: frequent urination   Urinary symptoms: no bladder incontinence   Associated symptoms: flank pain   Associated symptoms: no abdominal pain, no fever, no nausea and no vaginal discharge   Risk factors: recurrent urinary tract infections and sexually active   Risk factors: no renal disease and not single kidney     Past Medical History  Diagnosis Date  . NSVD (normal spontaneous vaginal delivery)     X 2  . Hypothyroidism   . Anxiety   . IUD 09/04/2010    Mirena IUD  . Eczema   . Kidney stones   . Fibroid, uterine   . Urinary tract infection    Past Surgical History   Procedure Laterality Date  . Dilatation & curettage/hysteroscopy with trueclear N/A 09/13/2012    Procedure: DILATATION & CURETTAGE/HYSTEROSCOPY TRUCLEAR RESECTOSCOPIC MYOMECTOMY, ;  Surgeon: Terrance Mass, MD;  Location: [facility];  Service: Gynecology;  Laterality: N/A;  TRUCLEAR RESECTOSCOPIC MYOMECTOMY, REMOVAL OF IUD   . Iud removal N/A 09/13/2012    Procedure: INTRAUTERINE DEVICE (IUD) REMOVAL;  Surgeon: Terrance Mass, MD;  Location: [facility];  Service: Gynecology;  Laterality: N/A;  . Mirena      Inserted 02-06-13  . Myomectomy    . Removal of fibroma     Family History  Problem Relation Age of Onset  . Hypertension Mother   . Diabetes Father   . Hypertension Father   . Colon polyps Mother   . Diabetes Other     grandparents on both sides  . Heart disease Father   . Heart disease Paternal Grandfather   . Heart disease Paternal Grandmother   . Kidney disease Father   . Colon cancer Neg Hx     ?  . Diabetes Maternal Grandmother   . Esophageal cancer Neg Hx    Social History  Substance Use Topics  . Smoking status: Never Smoker   . Smokeless tobacco: Never Used  . Alcohol Use: 0.0 oz/week    0 Standard drinks or equivalent per week     Comment: Rarely   OB History    Gravida Para Term Preterm AB TAB SAB Ectopic Multiple Living   2 2  2     Review of Systems  Constitutional: Negative for fever.  Gastrointestinal: Negative for nausea and abdominal pain.  Genitourinary: Positive for flank pain. Negative for vaginal discharge and pelvic pain.  Musculoskeletal: Positive for back pain.  Neurological: Negative for tingling and weakness.  All other systems reviewed and are negative.  Nurse's note was reviewed and patient does not smoke.  Allergies  Review of patient's allergies indicates no known allergies.  Home Medications   Prior to Admission medications   Medication Sig Start Date End Date Taking? Authorizing Provider  levothyroxine (SYNTHROID,  LEVOTHROID) 50 MCG tablet TAKE 1 TABLET DAILY 02/05/14   Terrance Mass, MD  nitrofurantoin, macrocrystal-monohydrate, (MACROBID) 100 MG capsule Take 1 capsule (100 mg total) by mouth 2 (two) times daily. 09/15/14   Frederich Cha, MD  phenazopyridine (PYRIDIUM) 200 MG tablet Take 1 tablet (200 mg total) by mouth 3 (three) times daily as needed for pain. 09/15/14   Frederich Cha, MD   Meds Ordered and Administered this Visit  Medications - No data to display  BP 106/52 mmHg  Pulse 76  Temp(Src) 96.5 F (35.8 C) (Tympanic)  Resp 18  Ht 5\' 3"  (1.6 m)  Wt 140 lb (63.504 kg)  BMI 24.81 kg/m2  SpO2 100% No data found.   Physical Exam  Constitutional: She is oriented to person, place, and time. She appears well-developed and well-nourished.  HENT:  Head: Normocephalic and atraumatic.  Eyes: Conjunctivae are normal. Pupils are equal, round, and reactive to light.  Cardiovascular: Regular rhythm.   Musculoskeletal: Normal range of motion.  Neurological: She is alert and oriented to person, place, and time.  Skin: Skin is warm and dry.  Psychiatric: She has a normal mood and affect. Her behavior is normal.  Vitals reviewed.   ED Course  Procedures (including critical care time)  Labs Review Labs Reviewed  URINALYSIS COMPLETEWITH MICROSCOPIC ([facility]) - Abnormal; Notable for the following:    Squamous Epithelial / LPF 0-5 (*)    All other components within normal limits  URINE CULTURE    Imaging Review No results found.   Visual Acuity Review  Right Eye Distance:   Left Eye Distance:   Bilateral Distance:    Right Eye Near:   Left Eye Near:    Bilateral Near:      Results for orders placed or performed during the hospital encounter of 09/15/14  Urinalysis complete, with microscopic  Result Value Ref Range   Color, Urine YELLOW YELLOW   APPearance CLEAR CLEAR   Glucose, UA NEGATIVE NEGATIVE mg/dL   Bilirubin Urine NEGATIVE NEGATIVE   Ketones, ur NEGATIVE NEGATIVE  mg/dL   Specific Gravity, Urine 1.025 1.005 - 1.030   Hgb urine dipstick NEGATIVE NEGATIVE   pH 5.0 5.0 - 8.0   Protein, ur NEGATIVE NEGATIVE mg/dL   Nitrite NEGATIVE NEGATIVE   Leukocytes, UA NEGATIVE NEGATIVE   RBC / HPF 0-5 <3 RBC/hpf   WBC, UA 0-5 <3 WBC/hpf   Bacteria, UA RARE RARE   Squamous Epithelial / LPF 0-5 (A) RARE      MDM   1. UTI (lower urinary tract infection)   2. Flank pain   3. Frequency     Explained that he UA is not that striking but this is old symptoms for the second day. Will treat with Macrobid 5 days Pyridium for 5 days cultures pending and work note for tomorrow if needed.   Frederich Cha, MD 09/15/14 1500

## 2014-09-15 NOTE — ED Notes (Signed)
Patient present shere with c/o low back pain . States that the symptoms are similar to the UTI in the past

## 2014-09-17 LAB — URINE CULTURE: Culture: >=100000

## 2014-10-29 ENCOUNTER — Ambulatory Visit (INDEPENDENT_AMBULATORY_CARE_PROVIDER_SITE_OTHER): Payer: BLUE CROSS/BLUE SHIELD | Admitting: Gynecology

## 2014-10-29 ENCOUNTER — Encounter: Payer: Self-pay | Admitting: Gynecology

## 2014-10-29 VITALS — BP 122/78 | Ht 62.0 in | Wt 136.0 lb

## 2014-10-29 DIAGNOSIS — Z01419 Encounter for gynecological examination (general) (routine) without abnormal findings: Secondary | ICD-10-CM | POA: Diagnosis not present

## 2014-10-29 DIAGNOSIS — R350 Frequency of micturition: Secondary | ICD-10-CM

## 2014-10-29 DIAGNOSIS — N3000 Acute cystitis without hematuria: Secondary | ICD-10-CM | POA: Diagnosis not present

## 2014-10-29 DIAGNOSIS — N83201 Unspecified ovarian cyst, right side: Secondary | ICD-10-CM | POA: Diagnosis not present

## 2014-10-29 DIAGNOSIS — N83202 Unspecified ovarian cyst, left side: Secondary | ICD-10-CM

## 2014-10-29 DIAGNOSIS — E038 Other specified hypothyroidism: Secondary | ICD-10-CM

## 2014-10-29 LAB — URINALYSIS W MICROSCOPIC + REFLEX CULTURE
Bilirubin Urine: NEGATIVE
Casts: NONE SEEN [LPF]
Crystals: NONE SEEN [HPF]
Glucose, UA: NEGATIVE
Ketones, ur: NEGATIVE
Nitrite: POSITIVE — AB
Protein, ur: NEGATIVE
Specific Gravity, Urine: 1.02 (ref 1.001–1.035)
Yeast: NONE SEEN [HPF]
pH: 6 (ref 5.0–8.0)

## 2014-10-29 MED ORDER — PHENAZOPYRIDINE HCL 200 MG PO TABS: 200.0000 mg | ORAL_TABLET | Freq: Three times a day (TID) | ORAL | Status: DC | PRN

## 2014-10-29 MED ORDER — SULFAMETHOXAZOLE-TRIMETHOPRIM 800-160 MG PO TABS: 1.0000 | ORAL_TABLET | Freq: Two times a day (BID) | ORAL | Status: DC

## 2014-10-29 NOTE — Progress Notes (Signed)
Julia Wagner 17-Jun-1970 [id number]   History:    44 y.o.  for annual gyn exam with complaint of urinary frequency. She denies any back pain, any dysuria, fever, chills, nausea, vomiting. Review of her record indicated that in September she was treated for urinary tract infection at the urgent care with [facility] for 7 days. Review of her urine cultures from the past and demonstrated that the organisms identified of been as follows: Enterobacter, lactobacillus, and Klebsiella.  Today's urinalysis demonstrated 20-40 WBC and many bacteria  Patient had a Mirena IUD placed in February 2015. She is doing well with no menstrual cycles reported. Patient had a resectoscopic myomectomy in September of 2014 and has had no further bleeding issues. Patient with no past history of abnormal Pap smears. Patient declined flu vaccine today. She is overdue for her mammogram.  Patient also been seen in January of this year because prior to that at time of her annual exam the IUD string was not seen. Ultrasound at that time demonstrated the following: Uterus measured 9.7 x 5.9 x 4.57 m with a meter stripe of 3.3 mm. IUD string was seen in the endometrial cavity bilateral ovarian cyst was noted the right ovary had an echo-free thinwall avascular cyst with Ace thin septum measuring 20.5 x 2.1 x 1.6 cm and the left ovary had an echo-free thinwall slightly vascular cyst measuring 3.7 x 3.4 x 2.6 and meters no fluid in the cul-de-sac  She had a normal CA 125 with a value of 14 it was to return back in 3 months for follow-up and she has not done so because of personal issues until now.   Past medical history,surgical history, family history and social history were all reviewed and documented in the EPIC chart.  Gynecologic History No LMP recorded. Patient is not currently having periods (Reason: IUD). Contraception: IUD Last Pap: 2015. Results were: normal Last mammogram: 2015. Results were: Normal but  dense  Obstetric History OB History  Gravida Para Term Preterm AB SAB TAB Ectopic Multiple Living  2 2        2     # Outcome Date GA Lbr Len/2nd Weight Sex Delivery Anes PTL Lv  2 Para           1 Para                ROS: A ROS was performed and pertinent positives and negatives are included in the history.  GENERAL: No fevers or chills. HEENT: No change in vision, no earache, sore throat or sinus congestion. NECK: No pain or stiffness. CARDIOVASCULAR: No chest pain or pressure. No palpitations. PULMONARY: No shortness of breath, cough or wheeze. GASTROINTESTINAL: No abdominal pain, nausea, vomiting or diarrhea, melena or bright red blood per rectum. GENITOURINARY: No urinary frequency, urgency, hesitancy or dysuria. MUSCULOSKELETAL: No joint or muscle pain, no back pain, no recent trauma. DERMATOLOGIC: No rash, no itching, no lesions. ENDOCRINE: No polyuria, polydipsia, no heat or cold intolerance. No recent change in weight. HEMATOLOGICAL: No anemia or easy bruising or bleeding. NEUROLOGIC: No headache, seizures, numbness, tingling or weakness. PSYCHIATRIC: No depression, no loss of interest in normal activity or change in sleep pattern.     Exam: chaperone present  BP 122/78 mmHg  Ht 5\' 2"  (1.575 m)  Wt 136 lb (61.689 kg)  BMI 24.87 kg/m2  Body mass index is 24.87 kg/(m^2).  General appearance : Well developed well nourished female. No acute distress HEENT: Eyes: no retinal hemorrhage  or exudates,  Neck supple, trachea midline, no carotid bruits, no thyroidmegaly Lungs: Clear to auscultation, no rhonchi or wheezes, or rib retractions  Heart: Regular rate and rhythm, no murmurs or gallops Breast:Examined in sitting and supine position were symmetrical in appearance, no palpable masses or tenderness,  no skin retraction, no nipple inversion, no nipple discharge, no skin discoloration, no axillary or supraclavicular lymphadenopathy Abdomen: no palpable masses or tenderness, no  rebound or guarding Extremities: no edema or skin discoloration or tenderness  Pelvic:  Bartholin, Urethra, Skene Glands: Within normal limits             Vagina: No gross lesions or discharge  Cervix: No gross lesions or discharge  Uterus  anteverted, normal size, shape and consistency, non-tender and mobile  Adnexa  Without masses or tenderness  Anus and perineum  normal   Rectovaginal  normal sphincter tone without palpated masses or tenderness             Hemoccult not indicated     Assessment/Plan:  44 y.o. female for annual exam will return back to the office next week for follow-up ultrasound and her bilateral ovarian cysts. She will come in a fasting state for the following screening blood will be ordered: Fasting lipid profile, comprehensive metabolic panel, TSH, CBC, and urinalysis. Patient reminded to schedule her overdue mammogram at the request a three-dimensional mammogram as a result of her last mammogram demonstrated that her breasts were dense. We discussed importance of monthly breast exam. For urinary tract infection she will be started on Septra DS 1 by mouth twice a day for 7 days. The patient has another urinary tract infection she'll be referred to the urologist for further evaluation.   Terrance Mass MD, 1:09 PM 10/29/2014

## 2014-10-29 NOTE — Patient Instructions (Signed)
Sulfamethoxazole; Trimethoprim, SMX-TMP tablets What is this medicine? SULFAMETHOXAZOLE; TRIMETHOPRIM or SMX-TMP (suhl fuh meth OK suh zohl; trye METH oh prim) is a combination of a sulfonamide antibiotic and a second antibiotic, trimethoprim. It is used to treat or prevent certain kinds of bacterial infections. It will not work for colds, flu, or other viral infections. This medicine may be used for other purposes; ask your health care provider or pharmacist if you have questions. What should I tell my health care provider before I take this medicine? They need to know if you have any of these conditions: -anemia -asthma -being treated with anticonvulsants -if you frequently drink alcohol containing drinks -kidney disease -liver disease -low level of folic acid or [id number] dehydrogenase -poor nutrition or malabsorption -porphyria -severe allergies -thyroid disorder -an unusual or allergic reaction to sulfamethoxazole, trimethoprim, sulfa drugs, other medicines, foods, dyes, or preservatives -pregnant or trying to get pregnant -breast-feeding How should I use this medicine? Take this medicine by mouth with a full glass of water. Follow the directions on the prescription label. Take your medicine at regular intervals. Do not take it more often than directed. Do not skip doses or stop your medicine early. Talk to your pediatrician regarding the use of this medicine in children. Special care may be needed. This medicine has been used in children as young as 77 months of age. Overdosage: If you think you have taken too much of this medicine contact a poison control center or emergency room at once. NOTE: This medicine is only for you. Do not share this medicine with others. What if I miss a dose? If you miss a dose, take it as soon as you can. If it is almost time for your next dose, take only that dose. Do not take double or extra doses. What may interact with this medicine? Do not  take this medicine with any of the following medications: -aminobenzoate potassium -dofetilide -metronidazole This medicine may also interact with the following medications: -ACE inhibitors like benazepril, enalapril, lisinopril, and ramipril -birth control pills -cyclosporine -digoxin -diuretics -indomethacin -medicines for diabetes -methenamine -methotrexate -phenytoin -potassium supplements -pyrimethamine -sulfinpyrazone -tricyclic antidepressants -warfarin This list may not describe all possible interactions. Give your health care provider a list of all the medicines, herbs, non-prescription drugs, or dietary supplements you use. Also tell them if you smoke, drink alcohol, or use illegal drugs. Some items may interact with your medicine. What should I watch for while using this medicine? Tell your doctor or health care professional if your symptoms do not improve. Drink several glasses of water a day to reduce the risk of kidney problems. Do not treat diarrhea with over the counter products. Contact your doctor if you have diarrhea that lasts more than 2 days or if it is severe and watery. This medicine can make you more sensitive to the sun. Keep out of the sun. If you cannot avoid being in the sun, wear protective clothing and use a sunscreen. Do not use sun lamps or tanning beds/booths. What side effects may I notice from receiving this medicine? Side effects that you should report to your doctor or health care professional as soon as possible: -allergic reactions like skin rash or hives, swelling of the face, lips, or tongue -breathing problems -fever or chills, sore throat -irregular heartbeat, chest pain -joint or muscle pain -pain or difficulty passing urine -red pinpoint spots on skin -redness, blistering, peeling or loosening of the skin, including inside the mouth -unusual bleeding or bruising -unusually  weak or tired -yellowing of the eyes or skin Side effects  that usually do not require medical attention (report to your doctor or health care professional if they continue or are bothersome): -diarrhea -dizziness -headache -loss of appetite -nausea, vomiting -nervousness This list may not describe all possible side effects. Call your doctor for medical advice about side effects. You may report side effects to FDA at 1-800-FDA-1088. Where should I keep my medicine? Keep out of the reach of children. Store at room temperature between 20 to 25 degrees C (68 to 77 degrees F). Protect from light. Throw away any unused medicine after the expiration date. NOTE: This sheet is a summary. It may not cover all possible information. If you have questions about this medicine, talk to your doctor, pharmacist, or health care provider.    2016, Elsevier/Gold Standard. (2012-07-28 14:38:26) Urinary Tract Infection Urinary tract infections (UTIs) can develop anywhere along your urinary tract. Your urinary tract is your body's drainage system for removing wastes and extra water. Your urinary tract includes two kidneys, two ureters, a bladder, and a urethra. Your kidneys are a pair of bean-shaped organs. Each kidney is about the size of your fist. They are located below your ribs, one on each side of your spine. CAUSES Infections are caused by microbes, which are microscopic organisms, including fungi, viruses, and bacteria. These organisms are so small that they can only be seen through a microscope. Bacteria are the microbes that most commonly cause UTIs. SYMPTOMS  Symptoms of UTIs may vary by age and gender of the patient and by the location of the infection. Symptoms in young women typically include a frequent and intense urge to urinate and a painful, burning feeling in the bladder or urethra during urination. Older women and men are more likely to be tired, shaky, and weak and have muscle aches and abdominal pain. A fever may mean the infection is in your kidneys.  Other symptoms of a kidney infection include pain in your back or sides below the ribs, nausea, and vomiting. DIAGNOSIS To diagnose a UTI, your caregiver will ask you about your symptoms. Your caregiver will also ask you to provide a urine sample. The urine sample will be tested for bacteria and white blood cells. White blood cells are made by your body to help fight infection. TREATMENT  Typically, UTIs can be treated with medication. Because most UTIs are caused by a bacterial infection, they usually can be treated with the use of antibiotics. The choice of antibiotic and length of treatment depend on your symptoms and the type of bacteria causing your infection. HOME CARE INSTRUCTIONS  If you were prescribed antibiotics, take them exactly as your caregiver instructs you. Finish the medication even if you feel better after you have only taken some of the medication.  Drink enough water and fluids to keep your urine clear or pale yellow.  Avoid caffeine, tea, and carbonated beverages. They tend to irritate your bladder.  Empty your bladder often. Avoid holding urine for long periods of time.  Empty your bladder before and after sexual intercourse.  After a bowel movement, women should cleanse from front to back. Use each tissue only once. SEEK MEDICAL CARE IF:   You have back pain.  You develop a fever.  Your symptoms do not begin to resolve within 3 days. SEEK IMMEDIATE MEDICAL CARE IF:   You have severe back pain or lower abdominal pain.  You develop chills.  You have nausea or vomiting.  You have continued burning or discomfort with urination. MAKE SURE YOU:   Understand these instructions.  Will watch your condition.  Will get help right away if you are not doing well or get worse.   This information is not intended to replace advice given to you by your health care provider. Make sure you discuss any questions you have with your health care provider.   Document  Released: 09/30/2004 Document Revised: 09/11/2014 Document Reviewed: 01/29/2011 Elsevier Interactive Patient Education [employer].

## 2014-10-30 ENCOUNTER — Other Ambulatory Visit: Payer: Self-pay

## 2014-10-30 DIAGNOSIS — Z1231 Encounter for screening mammogram for malignant neoplasm of breast: Secondary | ICD-10-CM

## 2014-11-02 LAB — URINE CULTURE: Colony Count: >=100000

## 2014-11-15 ENCOUNTER — Encounter: Payer: Self-pay | Admitting: Gynecology

## 2014-11-15 ENCOUNTER — Ambulatory Visit (INDEPENDENT_AMBULATORY_CARE_PROVIDER_SITE_OTHER): Payer: BLUE CROSS/BLUE SHIELD

## 2014-11-15 ENCOUNTER — Ambulatory Visit (INDEPENDENT_AMBULATORY_CARE_PROVIDER_SITE_OTHER): Payer: BLUE CROSS/BLUE SHIELD | Admitting: Gynecology

## 2014-11-15 VITALS — BP 118/76

## 2014-11-15 DIAGNOSIS — N83202 Unspecified ovarian cyst, left side: Secondary | ICD-10-CM | POA: Diagnosis not present

## 2014-11-15 DIAGNOSIS — Z8742 Personal history of other diseases of the female genital tract: Secondary | ICD-10-CM

## 2014-11-15 DIAGNOSIS — N83201 Unspecified ovarian cyst, right side: Secondary | ICD-10-CM | POA: Diagnosis not present

## 2014-11-15 LAB — CBC WITH DIFFERENTIAL/PLATELET
Basophils Absolute: 0.1 10*3/uL (ref 0.0–0.1)
Basophils Relative: 1 % (ref 0–1)
Eosinophils Absolute: 0.1 10*3/uL (ref 0.0–0.7)
Eosinophils Relative: 2 % (ref 0–5)
HCT: 35.9 % — ABNORMAL LOW (ref 36.0–46.0)
Hemoglobin: 12.3 g/dL (ref 12.0–15.0)
Lymphocytes Relative: 25 % (ref 12–46)
Lymphs Abs: 1.7 10*3/uL (ref 0.7–4.0)
MCH: 31.4 pg (ref 26.0–34.0)
MCHC: 34.3 g/dL (ref 30.0–36.0)
MCV: 91.6 fL (ref 78.0–100.0)
MPV: 9.3 fL (ref 8.6–12.4)
Monocytes Absolute: 0.6 10*3/uL (ref 0.1–1.0)
Monocytes Relative: 9 % (ref 3–12)
Neutro Abs: 4.3 10*3/uL (ref 1.7–7.7)
Neutrophils Relative %: 63 % (ref 43–77)
Platelets: 270 10*3/uL (ref 150–400)
RBC: 3.92 MIL/uL (ref 3.87–5.11)
RDW: 13.4 % (ref 11.5–15.5)
WBC: 6.9 10*3/uL (ref 4.0–10.5)

## 2014-11-15 LAB — LIPID PANEL
Cholesterol: 150 mg/dL (ref 125–200)
HDL: 52 mg/dL (ref >=46)
LDL Cholesterol: 82 mg/dL (ref <130)
Total CHOL/HDL Ratio: 2.9 Ratio (ref <=5.0)
Triglycerides: 78 mg/dL (ref <150)
VLDL: 16 mg/dL (ref <30)

## 2014-11-15 LAB — COMPREHENSIVE METABOLIC PANEL
ALT: 26 U/L (ref 6–29)
AST: 30 U/L (ref 10–35)
Albumin: 3.9 g/dL (ref 3.6–5.1)
Alkaline Phosphatase: 61 U/L (ref 33–115)
BUN: 11 mg/dL (ref 7–25)
CO2: 23 mmol/L (ref 20–31)
Calcium: 8.9 mg/dL (ref 8.6–10.2)
Chloride: 105 mmol/L (ref 98–110)
Creat: 0.54 mg/dL (ref 0.50–1.10)
Glucose, Bld: 82 mg/dL (ref 65–99)
Potassium: 4.7 mmol/L (ref 3.5–5.3)
Sodium: 139 mmol/L (ref 135–146)
Total Bilirubin: 1.7 mg/dL — ABNORMAL HIGH (ref 0.2–1.2)
Total Protein: 6.2 g/dL (ref 6.1–8.1)

## 2014-11-15 LAB — TSH: TSH: 3.973 u[IU]/mL (ref 0.350–4.500)

## 2014-11-15 NOTE — Progress Notes (Signed)
   Patient presented to the office today to discuss her ultrasound. She was seen the office on October 25 of this year for her annual exam. Review of her record indicated that in January 2016 and ultrasound had been done as a result of her IUD string not having been visualized and the ultrasound demonstrated the following:  Uterus measured 9.7 x 5.9 x 4.57 m with a meter stripe of 3.3 mm. IUD string was seen in the endometrial cavity bilateral ovarian cyst was noted the right ovary had an echo-free thinwall avascular cyst with Ace thin septum measuring 20.5 x 2.1 x 1.6 cm and the left ovary had an echo-free thinwall slightly vascular cyst measuring 3.7 x 3.4 x 2.6 and meters no fluid in the cul-de-sac  She had a normal CA 125 with a value of 14 it was to return back in 3 months for follow-up and she has not done so until today.  Ultrasound today demonstrated the following: Uterus measured 10.3 x 6.8 x 4.5 cm with endometrial stripe at 2.5 mm. IUD was visualized the normal position inside the uterine cavity. Right ovarian thinwall echo-free avascular follicle cyst 31 x 22 x 28 mm and 27 x 29 x 23 mm were noted. Left ovary was normal. No fluid in the cul-de-sac.  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 The IUD was seen in the proper position in the uterine cavity on ultrasound. We'll discontinue to monitor with annual ultrasounds unless patient becomes symptomatic to follow-up on the follicle cyst which are physiological. Patient was reassured. She was a systematic today.

## 2014-12-04 ENCOUNTER — Ambulatory Visit
Admission: RE | Admit: 2014-12-04 | Discharge: 2014-12-04 | Disposition: A | Discharge status: Home / Self Care | Payer: BLUE CROSS/BLUE SHIELD | Source: Ambulatory Visit

## 2014-12-04 DIAGNOSIS — Z1231 Encounter for screening mammogram for malignant neoplasm of breast: Secondary | ICD-10-CM

## 2015-03-04 ENCOUNTER — Encounter: Payer: Self-pay | Admitting: Gynecology

## 2015-03-04 ENCOUNTER — Ambulatory Visit (INDEPENDENT_AMBULATORY_CARE_PROVIDER_SITE_OTHER): Payer: BLUE CROSS/BLUE SHIELD | Admitting: Gynecology

## 2015-03-04 VITALS — BP 116/70

## 2015-03-04 DIAGNOSIS — R350 Frequency of micturition: Secondary | ICD-10-CM

## 2015-03-04 DIAGNOSIS — N3000 Acute cystitis without hematuria: Secondary | ICD-10-CM

## 2015-03-04 LAB — URINALYSIS W MICROSCOPIC + REFLEX CULTURE
Bilirubin Urine: NEGATIVE
Casts: NONE SEEN [LPF]
Crystals: NONE SEEN [HPF]
Glucose, UA: NEGATIVE
Hgb urine dipstick: NEGATIVE
Ketones, ur: NEGATIVE
Nitrite: POSITIVE — AB
Protein, ur: NEGATIVE
Specific Gravity, Urine: 1.02 (ref 1.001–1.035)
Yeast: NONE SEEN [HPF]
pH: 7 (ref 5.0–8.0)

## 2015-03-04 MED ORDER — SULFAMETHOXAZOLE-TRIMETHOPRIM 800-160 MG PO TABS: 1.0000 | ORAL_TABLET | Freq: Two times a day (BID) | ORAL | Status: DC

## 2015-03-04 NOTE — Progress Notes (Signed)
Julia Wagner August 10, 1970 [id number]        45 y.o.  G2P2 presents with one-day history of frequency and super pubic discomfort. No fever or chills. No dysuria or urgency. No vaginal discharge, irritation or odor.  Past medical history,surgical history, problem list, medications, allergies, family history and social history were all reviewed and documented in the EPIC chart.  Directed ROS with pertinent positives and negatives documented in the history of present illness/assessment and plan.  Exam: Caryn Bee assistant Filed Vitals:   03/04/15 0903  BP: 116/70   General appearance:  Normal Spine straight without CVA tenderness Abdomen soft nontender without masses guarding rebound Pelvic external BUS vagina normal.  Cervix normal, IUD string not visualized. Uterus anteverted midline mobile nontender. Adnexa without masses or tenderness  Assessment/Plan:  45 y.o. G2P2 with one day history of urinary frequency and some lower abdominal discomfort. Urinalysis consistent with UTI noting 40-60 WBC with many bacteria. Does have some squamous epithelium to suggest contaminated specimen. Will cover with Septra DS 1 by mouth twice a day 3 days. Follow up with culture. Patient will follow up if symptoms persist, worsen or recur. Her IUD string was not visualized and she noted that it was not seen previously by Dr. Toney Rakes but ultrasound 11/2014 documented intrauterine placement and she continues without menses.    Anastasio Auerbach MD, 9:32 AM 03/04/2015

## 2015-03-04 NOTE — Patient Instructions (Signed)
Take the antibiotic twice daily for 3 days.  Follow-up if your symptoms persist, worsen or recur. 

## 2015-03-05 LAB — URINE CULTURE
Colony Count: NO GROWTH
Organism ID, Bacteria: NO GROWTH

## 2015-03-12 ENCOUNTER — Telehealth: Payer: Self-pay | Admitting: *Deleted

## 2015-03-12 ENCOUNTER — Other Ambulatory Visit: Payer: PRIVATE HEALTH INSURANCE

## 2015-03-12 DIAGNOSIS — N390 Urinary tract infection, site not specified: Secondary | ICD-10-CM

## 2015-03-12 NOTE — Telephone Encounter (Signed)
Pt was treated for UTI on 03/04/15 with Septra DS 1 by mouth twice a day 3 days completed Rx pt wanted to come back to have urine recheck to confirm infection is gone

## 2015-03-13 LAB — URINALYSIS W MICROSCOPIC + REFLEX CULTURE
Bacteria, UA: NONE SEEN [HPF]
Bilirubin Urine: NEGATIVE
Casts: NONE SEEN [LPF]
Crystals: NONE SEEN [HPF]
Glucose, UA: NEGATIVE
Hgb urine dipstick: NEGATIVE
Ketones, ur: NEGATIVE
Nitrite: NEGATIVE
Protein, ur: NEGATIVE
RBC / HPF: NONE SEEN RBC/HPF (ref <=2)
Specific Gravity, Urine: 1.013 (ref 1.001–1.035)
Yeast: NONE SEEN [HPF]
pH: 7 (ref 5.0–8.0)

## 2015-03-14 LAB — URINE CULTURE
Colony Count: NO GROWTH
Organism ID, Bacteria: NO GROWTH

## 2015-04-29 ENCOUNTER — Other Ambulatory Visit: Payer: Self-pay | Admitting: Gynecology

## 2015-06-16 ENCOUNTER — Ambulatory Visit (INDEPENDENT_AMBULATORY_CARE_PROVIDER_SITE_OTHER): Payer: PRIVATE HEALTH INSURANCE | Admitting: Gynecology

## 2015-06-16 ENCOUNTER — Encounter: Payer: Self-pay | Admitting: Gynecology

## 2015-06-16 VITALS — BP 118/76

## 2015-06-16 DIAGNOSIS — R350 Frequency of micturition: Secondary | ICD-10-CM

## 2015-06-16 DIAGNOSIS — N760 Acute vaginitis: Secondary | ICD-10-CM

## 2015-06-16 DIAGNOSIS — B9689 Other specified bacterial agents as the cause of diseases classified elsewhere: Secondary | ICD-10-CM

## 2015-06-16 DIAGNOSIS — A499 Bacterial infection, unspecified: Secondary | ICD-10-CM | POA: Diagnosis not present

## 2015-06-16 DIAGNOSIS — N898 Other specified noninflammatory disorders of vagina: Secondary | ICD-10-CM | POA: Diagnosis not present

## 2015-06-16 LAB — URINALYSIS W MICROSCOPIC + REFLEX CULTURE
Bacteria, UA: NONE SEEN [HPF]
Bilirubin Urine: NEGATIVE
Casts: NONE SEEN [LPF]
Crystals: NONE SEEN [HPF]
Glucose, UA: NEGATIVE
Hgb urine dipstick: NEGATIVE
Ketones, ur: NEGATIVE
Leukocytes, UA: NEGATIVE
Nitrite: NEGATIVE
Protein, ur: NEGATIVE
RBC / HPF: NONE SEEN RBC/HPF (ref <=2)
Specific Gravity, Urine: 1.02 (ref 1.001–1.035)
Squamous Epithelial / LPF: NONE SEEN [HPF] (ref <=5)
WBC, UA: NONE SEEN WBC/HPF (ref <=5)
Yeast: NONE SEEN [HPF]
pH: 5.5 (ref 5.0–8.0)

## 2015-06-16 LAB — WET PREP FOR TRICH, YEAST, CLUE
Clue Cells Wet Prep HPF POC: NONE SEEN
Trich, Wet Prep: NONE SEEN
Yeast Wet Prep HPF POC: NONE SEEN

## 2015-06-16 MED ORDER — METRONIDAZOLE 500 MG PO TABS: 500.0000 mg | ORAL_TABLET | Freq: Two times a day (BID) | ORAL | Status: DC

## 2015-06-16 NOTE — Patient Instructions (Signed)
Metronidazole tablets or capsules What is this medicine? METRONIDAZOLE (me troe NI da zole) is an antiinfective. It is used to treat certain kinds of bacterial and protozoal infections. It will not work for colds, flu, or other viral infections. This medicine may be used for other purposes; ask your health care provider or pharmacist if you have questions. What should I tell my health care provider before I take this medicine? They need to know if you have any of these conditions: -anemia or other blood disorders -disease of the nervous system -fungal or yeast infection -if you drink alcohol containing drinks -liver disease -seizures -an unusual or allergic reaction to metronidazole, or other medicines, foods, dyes, or preservatives -pregnant or trying to get pregnant -breast-feeding How should I use this medicine? Take this medicine by mouth with a full glass of water. Follow the directions on the prescription label. Take your medicine at regular intervals. Do not take your medicine more often than directed. Take all of your medicine as directed even if you think you are better. Do not skip doses or stop your medicine early. Talk to your pediatrician regarding the use of this medicine in children. Special care may be needed. Overdosage: If you think you have taken too much of this medicine contact a poison control center or emergency room at once. NOTE: This medicine is only for you. Do not share this medicine with others. What if I miss a dose? If you miss a dose, take it as soon as you can. If it is almost time for your next dose, take only that dose. Do not take double or extra doses. What may interact with this medicine? Do not take this medicine with any of the following medications: -alcohol or any product that contains alcohol -amprenavir oral solution -cisapride -disulfiram -dofetilide -dronedarone -paclitaxel injection -pimozide -ritonavir oral solution -sertraline oral  solution -sulfamethoxazole-trimethoprim injection -thioridazine -ziprasidone This medicine may also interact with the following medications: -birth control pills -cimetidine -lithium -other medicines that prolong the QT interval (cause an abnormal heart rhythm) -phenobarbital -phenytoin -warfarin This list may not describe all possible interactions. Give your health care provider a list of all the medicines, herbs, non-prescription drugs, or dietary supplements you use. Also tell them if you smoke, drink alcohol, or use illegal drugs. Some items may interact with your medicine. What should I watch for while using this medicine? Tell your doctor or health care professional if your symptoms do not improve or if they get worse. You may get drowsy or dizzy. Do not drive, use machinery, or do anything that needs mental alertness until you know how this medicine affects you. Do not stand or sit up quickly, especially if you are an older patient. This reduces the risk of dizzy or fainting spells. Avoid alcoholic drinks while you are taking this medicine and for three days afterward. Alcohol may make you feel dizzy, sick, or flushed. If you are being treated for a sexually transmitted disease, avoid sexual contact until you have finished your treatment. Your sexual partner may also need treatment. What side effects may I notice from receiving this medicine? Side effects that you should report to your doctor or health care professional as soon as possible: -allergic reactions like skin rash or hives, swelling of the face, lips, or tongue -confusion, clumsiness -difficulty speaking -discolored or sore mouth -dizziness -fever, infection -numbness, tingling, pain or weakness in the hands or feet -trouble passing urine or change in the amount of urine -redness, blistering, peeling  or loosening of the skin, including inside the mouth -seizures -unusually weak or tired -vaginal irritation, dryness,  or discharge Side effects that usually do not require medical attention (report to your doctor or health care professional if they continue or are bothersome): -diarrhea -headache -irritability -metallic taste -nausea -stomach pain or cramps -trouble sleeping This list may not describe all possible side effects. Call your doctor for medical advice about side effects. You may report side effects to FDA at 1-800-FDA-1088. Where should I keep my medicine? Keep out of the reach of children. Store at room temperature below 25 degrees C (77 degrees F). Protect from light. Keep container tightly closed. Throw away any unused medicine after the expiration date. NOTE: This sheet is a summary. It may not cover all possible information. If you have questions about this medicine, talk to your doctor, pharmacist, or health care provider.    2016, Elsevier/Gold Standard. (2012-07-28 14:08:39) Bacterial Vaginosis Bacterial vaginosis is a vaginal infection that occurs when the normal balance of bacteria in the vagina is disrupted. It results from an overgrowth of certain bacteria. This is the most common vaginal infection in women of childbearing age. Treatment is important to prevent complications, especially in pregnant women, as it can cause a premature delivery. CAUSES  Bacterial vaginosis is caused by an increase in harmful bacteria that are normally present in smaller amounts in the vagina. Several different kinds of bacteria can cause bacterial vaginosis. However, the reason that the condition develops is not fully understood. RISK FACTORS Certain activities or behaviors can put you at an increased risk of developing bacterial vaginosis, including:  Having a new sex partner or multiple sex partners.  Douching.  Using an intrauterine device (IUD) for contraception. Women do not get bacterial vaginosis from toilet seats, bedding, swimming pools, or contact with objects around them. SIGNS AND  SYMPTOMS  Some women with bacterial vaginosis have no signs or symptoms. Common symptoms include:  Grey vaginal discharge.  A fishlike odor with discharge, especially after sexual intercourse.  Itching or burning of the vagina and vulva.  Burning or pain with urination. DIAGNOSIS  Your health care provider will take a medical history and examine the vagina for signs of bacterial vaginosis. A sample of vaginal fluid may be taken. Your health care provider will look at this sample under a microscope to check for bacteria and abnormal cells. A vaginal pH test may also be done.  TREATMENT  Bacterial vaginosis may be treated with antibiotic medicines. These may be given in the form of a pill or a vaginal cream. A second round of antibiotics may be prescribed if the condition comes back after treatment. Because bacterial vaginosis increases your risk for sexually transmitted diseases, getting treated can help reduce your risk for chlamydia, gonorrhea, HIV, and herpes. HOME CARE INSTRUCTIONS   Only take over-the-counter or prescription medicines as directed by your health care provider.  If antibiotic medicine was prescribed, take it as directed. Make sure you finish it even if you start to feel better.  Tell all sexual partners that you have a vaginal infection. They should see their health care provider and be treated if they have problems, such as a mild rash or itching.  During treatment, it is important that you follow these instructions:  Avoid sexual activity or use condoms correctly.  Do not douche.  Avoid alcohol as directed by your health care provider.  Avoid breastfeeding as directed by your health care provider. SEEK MEDICAL CARE IF:  Your symptoms are not improving after 3 days of treatment.  You have increased discharge or pain.  You have a fever. MAKE SURE YOU:   Understand these instructions.  Will watch your condition.  Will get help right away if you are not  doing well or get worse. FOR MORE INFORMATION  Centers for Disease Control and Prevention, Division of STD Prevention: Appraiser[employer]raud.fi American Sexual Health Association (ASHA): www.ashastd.org    This information is not intended to replace advice given to you by your health care provider. Make sure you discuss any questions you have with your health care provider.   Document Released: 12/21/2004 Document Revised: 01/11/2014 Document Reviewed: 08/02/2012 Elsevier Interactive Patient Education [employer].

## 2015-06-16 NOTE — Progress Notes (Signed)
   HPI: Patient is a 45 year old presented to the office as stated that last week she start expense and low back discomfort and urinary frequency but no dysuria she was having some slight discharge without any odor. She has an amine nausea relationship. She denies any nausea, vomiting, chills or any fever. Patient with history of Mirena IUD and has had at times small follicle cysts on either ovary. See previous notes.   ROS: A ROS was performed and pertinent positives and negatives are included in the history.  GENERAL: No fevers or chills. HEENT: No change in vision, no earache, sore throat or sinus congestion. NECK: No pain or stiffness. CARDIOVASCULAR: No chest pain or pressure. No palpitations. PULMONARY: No shortness of breath, cough or wheeze. GASTROINTESTINAL: No abdominal pain, nausea, vomiting or diarrhea, melena or bright red blood per rectum. GENITOURINARY: No urinary frequency, urgency, hesitancy or dysuria. MUSCULOSKELETAL: No joint or muscle pain, no back pain, no recent trauma. DERMATOLOGIC: No rash, no itching, no lesions. ENDOCRINE: No polyuria, polydipsia, no heat or cold intolerance. No recent change in weight. HEMATOLOGICAL: No anemia or easy bruising or bleeding. NEUROLOGIC: No headache, seizures, numbness, tingling or weakness. PSYCHIATRIC: No depression, no loss of interest in normal activity or change in sleep pattern.   PE: Blood pressure 118/76 Gen. appearance well-developed well nourished female in no acute distress Abdomen: Soft nontender no rebound or guarding Pelvic: Bartholin urethra Skene was within normal limits Vagina: Clear discharge no vaginal wall lesions seen Cervix: No lesions or discharge IUD string not visualized Uterus: Anteverted normal size shape and consistency Adnexa: No palpable masses or tenderness Rectal exam: Not done  Urinalysis: Negative  Wet prep many white blood cells many bacteria   Assessment Plan: Patient will be treated for high  suspicion of bacterial vaginosis based on many bacteria many white blood cells and her wet prep. Patient is a monogamous relationship. She will be treated with Flagyl 500 mg one by mouth twice a day for 7 days. Her urine will be submitted for culture. Patient otherwise scheduled to return back to the office at the end of the year for annual exam or when necessary.    Greater than 50% of time was spent in counseling and coordinating care of this patient.   Time of consultation: 15   Minutes.

## 2015-06-17 LAB — URINE CULTURE
Colony Count: NO GROWTH
Organism ID, Bacteria: NO GROWTH

## 2015-09-15 ENCOUNTER — Telehealth: Payer: Self-pay | Admitting: *Deleted

## 2015-09-15 MED ORDER — LEVOTHYROXINE SODIUM 50 MCG PO TABS: 50.0000 ug | ORAL_TABLET | Freq: Every day | ORAL | 0 refills | Status: DC

## 2015-09-15 NOTE — Telephone Encounter (Signed)
Pt called requesting Rx sent to pharmacy for synthroid 50 mg tablet to local pharmacy no longer uses mail order. Rx sent.

## 2016-04-21 ENCOUNTER — Other Ambulatory Visit: Payer: Self-pay | Admitting: Gynecology

## 2016-04-21 DIAGNOSIS — Z1231 Encounter for screening mammogram for malignant neoplasm of breast: Secondary | ICD-10-CM

## 2016-04-30 ENCOUNTER — Encounter: Payer: Self-pay | Admitting: Gynecology

## 2016-04-30 ENCOUNTER — Ambulatory Visit (INDEPENDENT_AMBULATORY_CARE_PROVIDER_SITE_OTHER): Payer: PRIVATE HEALTH INSURANCE | Admitting: Gynecology

## 2016-04-30 VITALS — BP 118/74 | Ht 62.0 in | Wt 149.0 lb

## 2016-04-30 DIAGNOSIS — Z01419 Encounter for gynecological examination (general) (routine) without abnormal findings: Secondary | ICD-10-CM | POA: Diagnosis not present

## 2016-04-30 DIAGNOSIS — N83201 Unspecified ovarian cyst, right side: Secondary | ICD-10-CM | POA: Diagnosis not present

## 2016-04-30 MED ORDER — LEVOTHYROXINE SODIUM 50 MCG PO TABS: 50.0000 ug | ORAL_TABLET | Freq: Every day | ORAL | 4 refills | Status: DC

## 2016-04-30 NOTE — Progress Notes (Addendum)
Julia Wagner 1970/06/27 [id number]   History:    46 y.o.  for annual gyn exam with no complaints today. Patient Mirena IUD placed in 2015 and reports normal menstrual cycles.Patient had a resectoscopic myomectomy in September of 2014 and has had no further bleeding issues. Patient with no past history of abnormal Pap smears. Patient declined flu vaccine today. Review of patient's record indicated since the IUD was not seen back in 2016 she had an ultrasound which the following was noted:  Uterus measured 9.7 x 5.9 x 4.57 m with a meter stripe of 3.3 mm. IUD string was seen in the endometrial cavity bilateral ovarian cyst was noted the right ovary had an echo-free thinwall avascular cyst with Ace thin septum measuring 20.5 x 2.1 x 1.6 cm and the left ovary had an echo-free thinwall slightly vascular cyst measuring 3.7 x 3.4 x 2.6 and meters no fluid in the cul-de-sac  She had a normal CA 125 with a value of 14  Past medical history,surgical history, family history and social history were all reviewed and documented in the EPIC chart.  Gynecologic History No LMP recorded. Patient is not currently having periods (Reason: IUD). Contraception: IUD Last Pap: 2012 and 2015. Results were: normal Last mammogram: 2016. Results were: normal  Obstetric History OB History  Gravida Para Term Preterm AB Living  2 2       2   SAB TAB Ectopic Multiple Live Births               # Outcome Date GA Lbr Len/2nd Weight Sex Delivery Anes PTL Lv  2 Para           1 Para                ROS: A ROS was performed and pertinent positives and negatives are included in the history.  GENERAL: No fevers or chills. HEENT: No change in vision, no earache, sore throat or sinus congestion. NECK: No pain or stiffness. CARDIOVASCULAR: No chest pain or pressure. No palpitations. PULMONARY: No shortness of breath, cough or wheeze. GASTROINTESTINAL: No abdominal pain, nausea, vomiting or diarrhea, melena or bright red  blood per rectum. GENITOURINARY: No urinary frequency, urgency, hesitancy or dysuria. MUSCULOSKELETAL: No joint or muscle pain, no back pain, no recent trauma. DERMATOLOGIC: No rash, no itching, no lesions. ENDOCRINE: No polyuria, polydipsia, no heat or cold intolerance. No recent change in weight. HEMATOLOGICAL: No anemia or easy bruising or bleeding. NEUROLOGIC: No headache, seizures, numbness, tingling or weakness. PSYCHIATRIC: No depression, no loss of interest in normal activity or change in sleep pattern.     Exam: chaperone present  BP 118/74   Ht 5\' 2"  (1.575 m)   Wt 149 lb (67.6 kg)   BMI 27.25 kg/m   Body mass index is 27.25 kg/m.  General appearance : Well developed well nourished female. No acute distress HEENT: Eyes: no retinal hemorrhage or exudates,  Neck supple, trachea midline, no carotid bruits, no thyroidmegaly Lungs: Clear to auscultation, no rhonchi or wheezes, or rib retractions  Heart: Regular rate and rhythm, no murmurs or gallops Breast:Examined in sitting and supine position were symmetrical in appearance, no palpable masses or tenderness,  no skin retraction, no nipple inversion, no nipple discharge, hyperpigmented lesion periareolar right breast 8:00 position no axillary or supraclavicular lymphadenopathy Abdomen: no palpable masses or tenderness, no rebound or guarding Extremities: no edema or skin discoloration or tenderness  Pelvic:  Bartholin, Urethra, Skene Glands: Within normal limits  Vagina: No gross lesions or discharge  Cervix: No gross lesions or discharge  Uterus  anteverted, normal size, shape and consistency, non-tender and mobile  Adnexa  Without masses or tenderness  Anus and perineum  normal   Rectovaginal  normal sphincter tone without palpated masses or tenderness             Hemoccult not indicated     Assessment/Plan:  46 y.o. female for annual exam will return back to the office next week in a fasting state for her blood  work as well as for an ultrasound to confirm that the IUD still in the proper position as well as to follow-up on the right ovarian cyst. The following labs will be ordered: Comprehensive metabolic panel, fasting lipid profile, TSH, CBC, and urinalysis. Patient with history of hypothyroidism on 50 g of Synthroid daily prescription refill was provided. We discussed importance of calcium vitamin D and weightbearing exercises for osteoporosis prevention. Pap smear was done today. She was reminded to schedule her mammogram which is overdue.  Patient will be referred to the dermatologist for further evaluation of this hyperpigmented lesion. Over right breast at the 8:00 position for possible biopsy.   Terrance Mass MD, 10:59 AM 04/30/2016

## 2016-04-30 NOTE — Addendum Note (Signed)
Addended by: Nelva Nay on: 04/30/2016 11:59 AM   Modules accepted: Orders

## 2016-05-01 LAB — URINALYSIS W MICROSCOPIC + REFLEX CULTURE
Bacteria, UA: NONE SEEN [HPF]
Bilirubin Urine: NEGATIVE
Casts: NONE SEEN [LPF]
Crystals: NONE SEEN [HPF]
Glucose, UA: NEGATIVE
Hgb urine dipstick: NEGATIVE
Ketones, ur: NEGATIVE
Nitrite: NEGATIVE
Protein, ur: NEGATIVE
RBC / HPF: NONE SEEN RBC/HPF (ref <=2)
Specific Gravity, Urine: 1.02 (ref 1.001–1.035)
Yeast: NONE SEEN [HPF]
pH: 6 (ref 5.0–8.0)

## 2016-05-03 ENCOUNTER — Other Ambulatory Visit: Payer: Self-pay | Admitting: Gynecology

## 2016-05-03 LAB — URINE CULTURE

## 2016-05-03 MED ORDER — NITROFURANTOIN MONOHYD MACRO 100 MG PO CAPS: 100.0000 mg | ORAL_CAPSULE | Freq: Two times a day (BID) | ORAL | 0 refills | Status: DC

## 2016-05-04 ENCOUNTER — Telehealth: Payer: Self-pay | Admitting: *Deleted

## 2016-05-04 LAB — PAP IG W/ RFLX HPV ASCU

## 2016-05-04 NOTE — Telephone Encounter (Signed)
-----   Message from Ramond Craver, [place] sent at 05/03/2016  2:18 PM EDT ----- Regarding: referral to dermatologist Per Dr. Moshe Salisbury "Patient will be referred to the dermatologist for further evaluation of this hyperpigmented lesion. Over right breast at the 8:00 position for possible biopsy."  Thanks

## 2016-05-04 NOTE — Telephone Encounter (Signed)
Pt scheduled on 05/17/16 @ 2:30pm with PA Arlyss Gandy at [facility]. Pt aware of appointment time and date.

## 2016-05-10 ENCOUNTER — Ambulatory Visit
Admission: RE | Admit: 2016-05-10 | Discharge: 2016-05-10 | Disposition: A | Discharge status: Home / Self Care | Payer: PRIVATE HEALTH INSURANCE | Source: Ambulatory Visit | Attending: Gynecology | Admitting: Gynecology

## 2016-05-10 DIAGNOSIS — Z1231 Encounter for screening mammogram for malignant neoplasm of breast: Secondary | ICD-10-CM

## 2016-05-12 ENCOUNTER — Other Ambulatory Visit: Payer: Self-pay | Admitting: Gynecology

## 2016-05-12 ENCOUNTER — Ambulatory Visit (INDEPENDENT_AMBULATORY_CARE_PROVIDER_SITE_OTHER): Payer: PRIVATE HEALTH INSURANCE | Admitting: Gynecology

## 2016-05-12 ENCOUNTER — Ambulatory Visit (INDEPENDENT_AMBULATORY_CARE_PROVIDER_SITE_OTHER): Payer: PRIVATE HEALTH INSURANCE

## 2016-05-12 DIAGNOSIS — Z87440 Personal history of urinary (tract) infections: Secondary | ICD-10-CM

## 2016-05-12 DIAGNOSIS — Z30431 Encounter for routine checking of intrauterine contraceptive device: Secondary | ICD-10-CM

## 2016-05-12 DIAGNOSIS — T8332XD Displacement of intrauterine contraceptive device, subsequent encounter: Secondary | ICD-10-CM

## 2016-05-12 DIAGNOSIS — N83201 Unspecified ovarian cyst, right side: Secondary | ICD-10-CM

## 2016-05-12 LAB — URINALYSIS W MICROSCOPIC + REFLEX CULTURE
Bacteria, UA: NONE SEEN [HPF]
Bilirubin Urine: NEGATIVE
Casts: NONE SEEN [LPF]
Crystals: NONE SEEN [HPF]
Glucose, UA: NEGATIVE
Hgb urine dipstick: NEGATIVE
Ketones, ur: NEGATIVE
Nitrite: NEGATIVE
Protein, ur: NEGATIVE
RBC / HPF: NONE SEEN RBC/HPF (ref <=2)
Specific Gravity, Urine: 1.023 (ref 1.001–1.035)
Yeast: NONE SEEN [HPF]
pH: 6.5 (ref 5.0–8.0)

## 2016-05-12 MED ORDER — MEDROXYPROGESTERONE ACETATE 150 MG/ML IM SUSP
150.0000 mg | Freq: Once | INTRAMUSCULAR | Status: AC
  Administered 2016-05-12: 150 mg via INTRAMUSCULAR

## 2016-05-12 NOTE — Patient Instructions (Addendum)
Ovarian Cyst  An ovarian cyst is a fluid-filled sac that forms on an ovary. The ovaries are small organs that produce eggs in women. Various types of cysts can form on the ovaries. Some may cause symptoms and require treatment. Most ovarian cysts go away on their own, are not cancerous (are benign), and do not cause problems. Common types of ovarian cysts include:  Functional (follicle) cysts.  Occur during the menstrual cycle, and usually go away with the next menstrual cycle if you do not get pregnant.  Usually cause no symptoms.  Endometriomas.  Are cysts that form from the tissue that lines the uterus (endometrium).  Are sometimes called "chocolate cysts" because they become filled with blood that turns brown.  Can cause pain in the lower abdomen during intercourse and during your period.  Cystadenoma cysts.  Develop from cells on the outside surface of the ovary.  Can get very large and cause lower abdomen pain and pain with intercourse.  Can cause severe pain if they twist or break open (rupture).  Dermoid cysts.  Are sometimes found in both ovaries.  May contain different kinds of body tissue, such as skin, teeth, hair, or cartilage.  Usually do not cause symptoms unless they get very big.  Theca lutein cysts.  Occur when too much of a certain hormone (human chorionic gonadotropin) is produced and overstimulates the ovaries to produce an egg.  Are most common after having procedures used to assist with the conception of a baby (in vitro fertilization). What are the causes? Ovarian cysts may be caused by:  Ovarian hyperstimulation syndrome. This is a condition that can develop from taking fertility medicines. It causes multiple large ovarian cysts to form.  Polycystic ovarian syndrome (PCOS). This is a common hormonal disorder that can cause ovarian cysts, as well as problems with your period or fertility. What increases the risk? The following factors may make you  more likely to develop ovarian cysts:  Being overweight or obese.  Taking fertility medicines.  Taking certain forms of hormonal birth control.  Smoking. What are the signs or symptoms? Many ovarian cysts do not cause symptoms. If symptoms are present, they may include:  Pelvic pain or pressure.  Pain in the lower abdomen.  Pain during sex.  Abdominal swelling.  Abnormal menstrual periods.  Increasing pain with menstrual periods. How is this diagnosed? These cysts are commonly found during a routine pelvic exam. You may have tests to find out more about the cyst, such as:  Ultrasound.  X-ray of the pelvis.  CT scan.  MRI.  Blood tests. How is this treated? Many ovarian cysts go away on their own without treatment. Your health care provider may want to check your cyst regularly for 2-3 months to see if it changes. If you are in menopause, it is especially important to have your cyst monitored closely because menopausal women have a higher rate of ovarian cancer. When treatment is needed, it may include:  Medicines to help relieve pain.  A procedure to drain the cyst (aspiration).  Surgery to remove the whole cyst.  Hormone treatment or birth control pills. These methods are sometimes used to help dissolve a cyst. Follow these instructions at home:  Take over-the-counter and prescription medicines only as told by your health care provider.  Do not drive or use heavy machinery while taking prescription pain medicine.  Get regular pelvic exams and Pap tests as often as told by your health care provider.  Return to your  normal activities as told by your health care provider. Ask your health care provider what activities are safe for you.  Do not use any products that contain nicotine or tobacco, such as cigarettes and e-cigarettes. If you need help quitting, ask your health care provider.  Keep all follow-up visits as told by your health care provider. This is  important. Contact a health care provider if:  Your periods are late, irregular, or painful, or they stop.  You have pelvic pain that does not go away.  You have pressure on your bladder or trouble emptying your bladder completely.  You have pain during sex.  You have any of the following in your abdomen:  A feeling of fullness.  Pressure.  Discomfort.  Pain that does not go away.  Swelling.  You feel generally ill.  You become constipated.  You lose your appetite.  You develop severe acne.  You start to have more body hair and facial hair.  You are gaining weight or losing weight without changing your exercise and eating habits.  You think you may be pregnant. Get help right away if:  You have abdominal pain that is severe or gets worse.  You cannot eat or drink without vomiting.  You suddenly develop a fever.  Your menstrual period is much heavier than usual. This information is not intended to replace advice given to you by your health care provider. Make sure you discuss any questions you have with your health care provider. Document Released: 12/21/2004 Document Revised: 07/11/2015 Document Reviewed: 05/25/2015 Elsevier Interactive Patient Education  2017 [place].  CA-125 Tumor Marker Test Why am I having this test? This test is used to check the level of cancer antigen 125 (CA-125) in your blood. The CA-125 tumor marker test can be helpful in detecting ovarian cancer. The test is only performed if you are considered at high risk for ovarian cancer. Your health care provider may recommend this test if:  You have a strong family history of ovarian cancer.  You have a breast cancer antigen (BRCA) genetic defect. If you have already been diagnosed with ovarian cancer, your health care provider may use this test to help identify the extent of the disease and to monitor your response to treatment. What kind of sample is taken? A blood sample is required  for this test. It is usually collected by inserting a needle into a vein. How do I prepare for this test? There is no preparation required for this test. What are the reference ranges? Reference ranges are considered healthy ranges established after testing a large group of healthy people. Reference ranges may vary among different people, labs, and hospitals. It is your responsibility to obtain your test results. Ask the lab or department performing the test when and how you will get your results. The reference range for this test is 0-35 units/mL or less than 35 kunits/L (SI units). What do the results mean? Increased levels of CA-125 may indicate:  Certain types of cancer, including:  Ovarian cancer.  Pancreatic cancer.  Colon cancer.  Lung cancer.  Breast cancer.  Lymphoma.  Noncancerous (benign) disorders, including:  Cirrhosis.  Pregnancy.  Endometriosis.  Pancreatitis.  Pelvic inflammatory disease (PID). Talk with your health care provider to discuss your results, treatment options, and if necessary, the need for more tests. Talk with your health care provider if you have any questions about your results. Talk with your health care provider to discuss your results, treatment options, and if  necessary, the need for more tests. Talk with your health care provider if you have any questions about your results. This information is not intended to replace advice given to you by your health care provider. Make sure you discuss any questions you have with your health care provider. Document Released: 01/13/2004 Document Revised: 08/26/2015 Document Reviewed: 05/10/2013 Elsevier Interactive Patient Education  2017 [employer].

## 2016-05-12 NOTE — Progress Notes (Signed)
   Patient is a 46 year old that was seen the office for her annual exam on 04/30/2016 she was asked to come to the office for an ultrasound because the Mirena IUD string could not be seen which had been placed in 2015. She had no other complaints. She was recently treated for urinary tract infection and completed her antibiotic Macrobid a few days ago and was here also to check a urinalysis. We've reviewed her recent mammogram which was normal as was her Pap smear. She's also here today for fasting blood work consisting of CBC, conference metabolic panel, fasting lipid profile, TSH and urinalysis.  Review of patient's record indicated since the IUD was not seen back in 2016 she had an ultrasound which the following was noted:  Uterus measured 9.7 x 5.9 x 4.57 m with a meter stripe of 3.3 mm. IUD string was seen in the endometrial cavity bilateral ovarian cyst was noted the right ovary had an echo-free thinwall avascular cyst with Ace thin septum measuring 20.5 x 2.1 x 1.6 cm and the left ovary had an echo-free thinwall slightly vascular cyst measuring 3.7 x 3.4 x 2.6 and meters no fluid in the cul-de-sac  She had a normal CA 125 with a value of 14  Ultrasound today: Uterus measured 9.4 x 5.6 x 4.1 cm with endometrial stripe at 2.5 mm. An intramural fibroid measuring 15 x 9 mm was noted. The IUD was seen in the normal position. Right ovary with a thinwall echo-free cyst measuring 30 x 14 x 28 mm avascular. Left ovary was normal. No fluid in the cul-de-sac.  Assessment for/plan: #1 patient status post being treated for urinary tract infection with Macrobid for one week asymptomatic we'll check a urinalysis today to confirm complete clearance of her infection #2 small insignificant uterine fibroid #3 right ovarian cyst seen on ultrasound IUD in the proper position. A shot of Depo-Provera 150 mg IM will be provided today patient to return back in 3 months for follow-up ultrasound to make sure that his  been resolution of this ovarian cyst. We'll also check a CA 125 today as well his limitations were discussed with the patient. #4 patient here fasting state she was not fasting at time of her annual exam to get her fasting screening blood work. #5 patient had been referred with a dermatologist for evaluation of a hyperpigmented lesion that was seen on exam on her right breast for possible biopsy she has an appointment coming up in the next few weeks.

## 2016-05-13 ENCOUNTER — Other Ambulatory Visit: Payer: Self-pay | Admitting: Gynecology

## 2016-05-13 DIAGNOSIS — R8271 Bacteriuria: Secondary | ICD-10-CM

## 2016-05-13 LAB — CA 125: CA 125: 13 U/mL (ref <35)

## 2016-05-13 LAB — URINE CULTURE

## 2016-05-13 MED ORDER — NITROFURANTOIN MONOHYD MACRO 100 MG PO CAPS: 100.0000 mg | ORAL_CAPSULE | Freq: Two times a day (BID) | ORAL | 0 refills | Status: DC

## 2016-05-13 MED ORDER — CLINDAMYCIN PHOSPHATE 2 % VA CREA: 1.0000 | TOPICAL_CREAM | Freq: Every day | VAGINAL | 0 refills | Status: DC

## 2016-05-19 ENCOUNTER — Encounter: Payer: Self-pay | Admitting: Gynecology

## 2016-05-27 ENCOUNTER — Other Ambulatory Visit: Payer: Self-pay | Admitting: Gynecology

## 2016-05-27 ENCOUNTER — Other Ambulatory Visit: Payer: Self-pay

## 2016-05-27 ENCOUNTER — Other Ambulatory Visit: Payer: PRIVATE HEALTH INSURANCE

## 2016-05-27 DIAGNOSIS — R8271 Bacteriuria: Secondary | ICD-10-CM

## 2016-05-28 LAB — URINALYSIS W MICROSCOPIC + REFLEX CULTURE
Bacteria, UA: NONE SEEN [HPF]
Bilirubin Urine: NEGATIVE
Casts: NONE SEEN [LPF]
Crystals: NONE SEEN [HPF]
Glucose, UA: NEGATIVE
Hgb urine dipstick: NEGATIVE
Ketones, ur: NEGATIVE
Nitrite: NEGATIVE
Protein, ur: NEGATIVE
Specific Gravity, Urine: 1.005 (ref 1.001–1.035)
Yeast: NONE SEEN [HPF]
pH: 6.5 (ref 5.0–8.0)

## 2016-05-30 LAB — URINE CULTURE

## 2016-06-02 ENCOUNTER — Other Ambulatory Visit: Payer: Self-pay | Admitting: Gynecology

## 2016-06-02 MED ORDER — CIPROFLOXACIN HCL 250 MG PO TABS: 250.0000 mg | ORAL_TABLET | Freq: Two times a day (BID) | ORAL | 0 refills | Status: DC

## 2016-07-28 ENCOUNTER — Encounter: Payer: Self-pay | Admitting: Gynecology

## 2016-07-28 ENCOUNTER — Ambulatory Visit (INDEPENDENT_AMBULATORY_CARE_PROVIDER_SITE_OTHER): Payer: 59 | Admitting: Gynecology

## 2016-07-28 ENCOUNTER — Ambulatory Visit (INDEPENDENT_AMBULATORY_CARE_PROVIDER_SITE_OTHER): Payer: 59

## 2016-07-28 VITALS — BP 120/82

## 2016-07-28 DIAGNOSIS — N83201 Unspecified ovarian cyst, right side: Secondary | ICD-10-CM

## 2016-07-28 DIAGNOSIS — Z8742 Personal history of other diseases of the female genital tract: Secondary | ICD-10-CM | POA: Diagnosis not present

## 2016-07-28 NOTE — Progress Notes (Signed)
   Patient is a 46 year old was seen the office in April 27 of this year for her annual exam was due to come for her blood work was done fasting. She is here for follow-up on right ovarian cyst.  Review of patient's record indicated since the IUD was not seen back in 2016 she had an ultrasound which the following was noted:  Uterus measured 9.7 x 5.9 x 4.57 m with a meter stripe of 3.3 mm. IUD string was seen in the endometrial cavity bilateral ovarian cyst was noted the right ovary had an echo-free thinwall avascular cyst with Ace thin septum measuring 20.5 x 2.1 x 1.6 cm and the left ovary had an echo-free thinwall slightly vascular cyst measuring 3.7 x 3.4 x 2.6 and meters no fluid in the cul-de-sac  She had a normal CA 125 with a value of 14  Ultrasound May 12, 2016: Uterus measured 9.4 x 5.6 x 4.1 cm with endometrial stripe at 2.5 mm. An intramural fibroid measuring 15 x 9 mm was noted. The IUD was seen in the normal position. Right ovary with a thinwall echo-free cyst measuring 30 x 14 x 28 mm avascular. Left ovary was normal. No fluid in the cul-de-sac.  Ultrasound today: Uterus measured 8.3 x 5.7 x 4.0 cm with endometrial stripe at 2.4 mm. IUD was seen in the normal position. No change with fibroids. Right follicle echo-free follicle 11 x 14 mm. Previous right ovarian cyst not seen. Left ovary was normal. No fluid in the cul-de-sac.  Review of patient's record from November 2016 had indicated that her total bilirubin was slightly elevated at 1.7 the previous time that it was checked by another provider was in 2015 which was elevated at 2.5 indirect was elevated 2.1 and direct 0.4. She is going to return to the office this week for fasting blood work that was due at time of her annual exam and she was not fasting.  Assessment/plan: Ultrasound today demonstrates complete resolution of previous right ovarian cyst IUD in the proper position. A small fibroids unchanged from previous studies.  Patient with past history of hyperbilirubinemia from 2015 2016 appeared to be decreasing. She's going to return back to the office this week for the following fasting blood work that was due at time of her annual exam: Fasting lipid profile, comprehensive metabolic panel, TSH, CBC, and urinalysis.

## 2016-07-29 LAB — URINALYSIS W MICROSCOPIC + REFLEX CULTURE
Bacteria, UA: NONE SEEN [HPF]
Bilirubin Urine: NEGATIVE
Casts: NONE SEEN [LPF]
Crystals: NONE SEEN [HPF]
Glucose, UA: NEGATIVE
Hgb urine dipstick: NEGATIVE
Ketones, ur: NEGATIVE
Nitrite: NEGATIVE
Protein, ur: NEGATIVE
RBC / HPF: NONE SEEN RBC/HPF (ref <=2)
Specific Gravity, Urine: 1.018 (ref 1.001–1.035)
Yeast: NONE SEEN [HPF]
pH: 5.5 (ref 5.0–8.0)

## 2016-07-30 ENCOUNTER — Other Ambulatory Visit: Payer: Self-pay | Admitting: *Deleted

## 2016-07-30 ENCOUNTER — Other Ambulatory Visit: Payer: 59

## 2016-07-30 LAB — CBC WITH DIFFERENTIAL/PLATELET
Basophils Absolute: 59 cells/uL (ref 0–200)
Basophils Relative: 1 %
Eosinophils Absolute: 118 cells/uL (ref 15–500)
Eosinophils Relative: 2 %
HCT: 39.1 % (ref 35.0–45.0)
Hemoglobin: 13.1 g/dL (ref 11.7–15.5)
Lymphocytes Relative: 29 %
Lymphs Abs: 1711 cells/uL (ref 850–3900)
MCH: 31.3 pg (ref 27.0–33.0)
MCHC: 33.5 g/dL (ref 32.0–36.0)
MCV: 93.3 fL (ref 80.0–100.0)
MPV: 9.2 fL (ref 7.5–12.5)
Monocytes Absolute: 413 cells/uL (ref 200–950)
Monocytes Relative: 7 %
Neutro Abs: 3599 cells/uL (ref 1500–7800)
Neutrophils Relative %: 61 %
Platelets: 274 10*3/uL (ref 140–400)
RBC: 4.19 MIL/uL (ref 3.80–5.10)
RDW: 13.4 % (ref 11.0–15.0)
WBC: 5.9 10*3/uL (ref 3.8–10.8)

## 2016-07-30 LAB — URINE CULTURE

## 2016-07-30 LAB — TSH: TSH: 3.58 mIU/L

## 2016-07-30 MED ORDER — NITROFURANTOIN MONOHYD MACRO 100 MG PO CAPS: 100.0000 mg | ORAL_CAPSULE | Freq: Two times a day (BID) | ORAL | 0 refills | Status: DC

## 2016-07-31 LAB — COMPREHENSIVE METABOLIC PANEL
ALT: 10 U/L (ref 6–29)
AST: 17 U/L (ref 10–35)
Albumin: 4.2 g/dL (ref 3.6–5.1)
Alkaline Phosphatase: 63 U/L (ref 33–115)
BUN: 12 mg/dL (ref 7–25)
CO2: 19 mmol/L — ABNORMAL LOW (ref 20–31)
Calcium: 8.8 mg/dL (ref 8.6–10.2)
Chloride: 104 mmol/L (ref 98–110)
Creat: 0.59 mg/dL (ref 0.50–1.10)
Glucose, Bld: 76 mg/dL (ref 65–99)
Potassium: 4 mmol/L (ref 3.5–5.3)
Sodium: 138 mmol/L (ref 135–146)
Total Bilirubin: 2 mg/dL — ABNORMAL HIGH (ref 0.2–1.2)
Total Protein: 6.5 g/dL (ref 6.1–8.1)

## 2016-07-31 LAB — LIPID PANEL
Cholesterol: 145 mg/dL (ref <200)
HDL: 47 mg/dL — ABNORMAL LOW (ref >50)
LDL Cholesterol: 84 mg/dL (ref <100)
Total CHOL/HDL Ratio: 3.1 Ratio (ref <5.0)
Triglycerides: 72 mg/dL (ref <150)
VLDL: 14 mg/dL (ref <30)

## 2016-08-02 ENCOUNTER — Other Ambulatory Visit: Payer: Self-pay | Admitting: Gynecology

## 2016-08-02 ENCOUNTER — Other Ambulatory Visit: Payer: 59

## 2016-08-02 DIAGNOSIS — R17 Unspecified jaundice: Secondary | ICD-10-CM

## 2016-09-13 ENCOUNTER — Ambulatory Visit (INDEPENDENT_AMBULATORY_CARE_PROVIDER_SITE_OTHER): Payer: 59 | Admitting: Obstetrics & Gynecology

## 2016-09-13 ENCOUNTER — Encounter: Payer: Self-pay | Admitting: Obstetrics & Gynecology

## 2016-09-13 VITALS — BP 126/68

## 2016-09-13 DIAGNOSIS — R350 Frequency of micturition: Secondary | ICD-10-CM | POA: Diagnosis not present

## 2016-09-13 DIAGNOSIS — N898 Other specified noninflammatory disorders of vagina: Secondary | ICD-10-CM

## 2016-09-13 LAB — WET PREP FOR TRICH, YEAST, CLUE

## 2016-09-13 MED ORDER — SULFAMETHOXAZOLE-TRIMETHOPRIM 800-160 MG PO TABS: 1.0000 | ORAL_TABLET | Freq: Two times a day (BID) | ORAL | 0 refills | Status: AC

## 2016-09-13 MED ORDER — FLUCONAZOLE 150 MG PO TABS: 150.0000 mg | ORAL_TABLET | Freq: Every day | ORAL | 1 refills | Status: AC

## 2016-09-13 NOTE — Addendum Note (Signed)
Addended by: Thurnell Garbe A on: 09/13/2016 12:09 PM   Modules accepted: Orders

## 2016-09-13 NOTE — Patient Instructions (Signed)
1. Urinary frequency Probable UTI.  No evidence of Pyelonephritis.  Will treat with Bactrim DS 1 tab PO BID x 3 days pending U. Culture.  Fluconazole x 1 tab after ABTx to prevent or treat Yeast vaginitis.    2. Vaginal discharge Wet prep negative. - WET PREP FOR [place], YEAST, CLUE  [place], it was a pleasure meeting you today!  I will inform you of your Urine Culture result as soon as available.   Urinary Tract Infection, Adult A urinary tract infection (UTI) is an infection of any part of the urinary tract, which includes the kidneys, ureters, bladder, and urethra. These organs make, store, and get rid of urine in the body. UTI can be a bladder infection (cystitis) or kidney infection (pyelonephritis). What are the causes? This infection may be caused by fungi, viruses, or bacteria. Bacteria are the most common cause of UTIs. This condition can also be caused by repeated incomplete emptying of the bladder during urination. What increases the risk? This condition is more likely to develop if:  You ignore your need to urinate or hold urine for long periods of time.  You do not empty your bladder completely during urination.  You wipe back to front after urinating or having a bowel movement, if you are female.  You are uncircumcised, if you are female.  You are constipated.  You have a urinary catheter that stays in place (indwelling).  You have a weak defense (immune) system.  You have a medical condition that affects your bowels, kidneys, or bladder.  You have diabetes.  You take antibiotic medicines frequently or for long periods of time, and the antibiotics no longer work well against certain types of infections (antibiotic resistance).  You take medicines that irritate your urinary tract.  You are exposed to chemicals that irritate your urinary tract.  You are female.  What are the signs or symptoms? Symptoms of this condition include:  Fever.  Frequent urination or  passing small amounts of urine frequently.  Needing to urinate urgently.  Pain or burning with urination.  Urine that smells bad or unusual.  Cloudy urine.  Pain in the lower abdomen or back.  Trouble urinating.  Blood in the urine.  Vomiting or being less hungry than normal.  Diarrhea or abdominal pain.  Vaginal discharge, if you are female.  How is this diagnosed? This condition is diagnosed with a medical history and physical exam. You will also need to provide a urine sample to test your urine. Other tests may be done, including:  Blood tests.  Sexually transmitted disease (STD) testing.  If you have had more than one UTI, a cystoscopy or imaging studies may be done to determine the cause of the infections. How is this treated? Treatment for this condition often includes a combination of two or more of the following:  Antibiotic medicine.  Other medicines to treat less common causes of UTI.  Over-the-counter medicines to treat pain.  Drinking enough water to stay hydrated.  Follow these instructions at home:  Take over-the-counter and prescription medicines only as told by your health care provider.  If you were prescribed an antibiotic, take it as told by your health care provider. Do not stop taking the antibiotic even if you start to feel better.  Avoid alcohol, caffeine, tea, and carbonated beverages. They can irritate your bladder.  Drink enough fluid to keep your urine clear or pale yellow.  Keep all follow-up visits as told by your health care provider.  This is important.  Make sure to: ? Empty your bladder often and completely. Do not hold urine for long periods of time. ? Empty your bladder before and after sex. ? Wipe from front to back after a bowel movement if you are female. Use each tissue one time when you wipe. Contact a health care provider if:  You have back pain.  You have a fever.  You feel nauseous or vomit.  Your symptoms do  not get better after 3 days.  Your symptoms go away and then return. Get help right away if:  You have severe back pain or lower abdominal pain.  You are vomiting and cannot keep down any medicines or water. This information is not intended to replace advice given to you by your health care provider. Make sure you discuss any questions you have with your health care provider. Document Released: 09/30/2004 Document Revised: 06/04/2015 Document Reviewed: 11/11/2014 Elsevier Interactive Patient Education  2017 [employer].

## 2016-09-13 NOTE — Progress Notes (Signed)
    Julia Wagner 06/28/1970 [id number]        46 y.o.  G2P2   RP:  Urinary frequency and urgency with mild vaginal itching and low back pain  HPI:  H/O recurrent UTIs.  Sxs x a few days similar to previous episodes of UTIs.  Urinary frequency and urgency, no pain with urination.  Mild vulvar itching.  Lower back pressure.  No fever.  Past medical history,surgical history, problem list, medications, allergies, family history and social history were all reviewed and documented in the EPIC chart.  Directed ROS with pertinent positives and negatives documented in the history of present illness/assessment and plan.  Exam:  Vitals:   09/13/16 1025  BP: 126/68   General appearance:  Normal  CVAT neg bilaterally.  Gyn exam:  Vulva normal.  Speculum:  Increased d/c.  Wet prep done.  Cervix/Vagina normal.  U/A:  WBC 6-10, Bacteria Moderate, Nit neg.  Pending U. Culture  Assessment/Plan:  46 y.o. G2P2   1. Urinary frequency Probable UTI.  No evidence of Pyelonephritis.  Will treat with Bactrim DS 1 tab PO BID x 3 days pending U. Culture.  Fluconazole x 1 tab after ABTx to prevent or treat Yeast vaginitis.    2. Vaginal discharge Wet prep negative. - WET PREP FOR [place], YEAST, CLUE  Counseling on above issues >50% x 15 minutes.  Princess Bruins MD, 10:59 AM 09/13/2016

## 2016-09-15 LAB — URINALYSIS W MICROSCOPIC + REFLEX CULTURE
Bilirubin Urine: NEGATIVE
Glucose, UA: NEGATIVE
Hgb urine dipstick: NEGATIVE
Hyaline Cast: NONE SEEN /LPF
Ketones, ur: NEGATIVE
Nitrites, Initial: NEGATIVE
Protein, ur: NEGATIVE
RBC / HPF: NONE SEEN /HPF (ref 0–2)
Specific Gravity, Urine: 1.026 (ref 1.001–1.03)
pH: 5.5 (ref 5.0–8.0)

## 2016-09-15 LAB — URINE CULTURE
MICRO NUMBER:: 80999012
SPECIMEN QUALITY:: ADEQUATE

## 2016-09-15 LAB — CULTURE INDICATED

## 2016-09-16 ENCOUNTER — Telehealth: Payer: Self-pay | Admitting: *Deleted

## 2016-09-16 MED ORDER — AMOXICILLIN 250 MG PO CAPS: 250.0000 mg | ORAL_CAPSULE | Freq: Three times a day (TID) | ORAL | 0 refills | Status: DC

## 2016-09-16 NOTE — Telephone Encounter (Signed)
Patient was seen on 09/13/16 for UTI prescribed Septra DS bid x 3 days took last pill today. Pt said since Tuesday she has noticed abdominal bloating, passing gas, having normal bowel movements, states she still having frequent urination than normally, states lower back discomfort is much better. Any recommendations?

## 2016-09-16 NOTE — Telephone Encounter (Signed)
Pt aware Rx sent.  

## 2016-09-16 NOTE — Telephone Encounter (Signed)
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

## 2016-11-29 ENCOUNTER — Telehealth: Payer: Self-pay | Admitting: *Deleted

## 2016-11-29 ENCOUNTER — Encounter: Payer: Self-pay | Admitting: Women's Health

## 2016-11-29 ENCOUNTER — Ambulatory Visit: Payer: 59 | Admitting: Women's Health

## 2016-11-29 VITALS — BP 120/78

## 2016-11-29 DIAGNOSIS — N898 Other specified noninflammatory disorders of vagina: Secondary | ICD-10-CM | POA: Diagnosis not present

## 2016-11-29 DIAGNOSIS — R350 Frequency of micturition: Secondary | ICD-10-CM

## 2016-11-29 LAB — WET PREP FOR TRICH, YEAST, CLUE

## 2016-11-29 MED ORDER — PHENAZOPYRIDINE HCL 200 MG PO TABS: 200.0000 mg | ORAL_TABLET | Freq: Three times a day (TID) | ORAL | 0 refills | Status: DC | PRN

## 2016-11-29 NOTE — Telephone Encounter (Signed)
Patient called c/o frequent urination, lower back discomfort, no pain now. States she feels pressure on kidneys,asked if she should see urologist, I advised OV with provider first as provider will refer if needed. Transferred to front desk.

## 2016-11-29 NOTE — Patient Instructions (Signed)

## 2016-11-29 NOTE — Progress Notes (Signed)
46 yo MWF G2P2 presents with complaints of urinary frequency with pressure sensation, and low back pain that has been increasing in severity over the past 2 weeks. Frequent urination throughout the day and night, mild dysuria, localizes pain to lumbosacral paraspinous region and increased pain with sudden movement, minimal clear vaginal discharge without odor. Denies any urinary incontinence, hematuria, fever, n/v, and vaginal pruritus. Same partner, Mirena IUD placed 2015, amenorrhea. History of recurrent UTIs with most recent UTI symptoms 09/2016, urine culture with Lactobacillus, completed course of Bactrim and Amoxicillin. Denies correlation between intercourse and UTI. Kidney stones age 33. Medical history significant for uterine fibroids, right ovarian cysts, and hypothyroidism. 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 Reports drinking mostly water and diet Coke. IUD strings not visible, placement confirmed on ultrasound.  Exam: Appears well, came from work. No CVAT. External genitalia within normal limits, Speculum exam: white discharge with normal cervix, no erythema, IUD strings not visualized, Wet prep: negative. UA: trace blood, negative nitrites, 1+ leukocytes, 10-20 WBC, no RBC, moderate bacteria  Frequent urination with mild dysuria  Plan: Pyridium 200 mg tid for dysuria, advised about orange discoloration of urine. Reviewed UTI precautions. Discussed UA results, urine culture pending. Discussed the possibility of further evaluation by urologist if symptoms worsen or do not improve. Instructed to call or RTO if no improvement. Encouraged water and to avoid diet Coke.

## 2016-12-01 LAB — URINALYSIS W MICROSCOPIC + REFLEX CULTURE
Bilirubin Urine: NEGATIVE
Glucose, UA: NEGATIVE
Hyaline Cast: NONE SEEN /LPF
Ketones, ur: NEGATIVE
Nitrites, Initial: NEGATIVE
Protein, ur: NEGATIVE
RBC / HPF: NONE SEEN /HPF (ref 0–2)
Specific Gravity, Urine: 1.005 (ref 1.001–1.03)
pH: 5.5 (ref 5.0–8.0)

## 2016-12-01 LAB — CULTURE INDICATED

## 2016-12-01 LAB — URINE CULTURE
MICRO NUMBER:: 81326525
SPECIMEN QUALITY:: ADEQUATE

## 2016-12-02 ENCOUNTER — Other Ambulatory Visit: Payer: Self-pay | Admitting: Women's Health

## 2016-12-02 MED ORDER — SULFAMETHOXAZOLE-TRIMETHOPRIM 800-160 MG PO TABS: 1.0000 | ORAL_TABLET | Freq: Two times a day (BID) | ORAL | 0 refills | Status: DC

## 2016-12-03 ENCOUNTER — Telehealth: Payer: Self-pay | Admitting: *Deleted

## 2016-12-03 NOTE — Telephone Encounter (Signed)
Referral faxed to [facility] urology for urinary frequent. They will contact pt to schedule.

## 2016-12-03 NOTE — Telephone Encounter (Signed)
-----   Message from Ramond Craver, [place] sent at 12/02/2016  5:09 PM EST ----- Regarding: referral to urologist Per [place] refer to urologist (see result note and last visit.). Patient knows.  Thanks!!

## 2016-12-20 NOTE — Telephone Encounter (Signed)
Pt did hear from [facility], she has not schedule with them yet.  Encounter will be closed.

## 2017-05-06 ENCOUNTER — Encounter: Payer: PRIVATE HEALTH INSURANCE | Admitting: Obstetrics & Gynecology

## 2017-07-13 ENCOUNTER — Other Ambulatory Visit: Payer: Self-pay

## 2017-07-13 MED ORDER — LEVOTHYROXINE SODIUM 50 MCG PO TABS: 50.0000 ug | ORAL_TABLET | Freq: Every day | ORAL | 0 refills | Status: DC

## 2017-08-08 ENCOUNTER — Other Ambulatory Visit: Payer: Self-pay | Admitting: Obstetrics & Gynecology

## 2017-08-08 DIAGNOSIS — Z1231 Encounter for screening mammogram for malignant neoplasm of breast: Secondary | ICD-10-CM

## 2017-08-19 ENCOUNTER — Encounter: Payer: Self-pay | Admitting: Obstetrics & Gynecology

## 2017-08-19 ENCOUNTER — Ambulatory Visit: Payer: 59 | Admitting: Obstetrics & Gynecology

## 2017-08-19 VITALS — BP 104/72 | Ht 62.0 in | Wt 150.0 lb

## 2017-08-19 DIAGNOSIS — Z30431 Encounter for routine checking of intrauterine contraceptive device: Secondary | ICD-10-CM

## 2017-08-19 DIAGNOSIS — Z01419 Encounter for gynecological examination (general) (routine) without abnormal findings: Secondary | ICD-10-CM

## 2017-08-19 NOTE — Patient Instructions (Addendum)
1. Well female exam with routine gynecological exam Normal gynecologic exam.  Pap test was negative with negative high-risk HPV on April 2018.  Breast exam normal.  Screening mammogram scheduled for next week.  Health labs with family physician.  2. Encounter for routine checking of intrauterine contraceptive device (IUD) Mirena IUD in good position, well tolerated.  F/U 02/2018 to remove/reinsert a new Mirena IUD.  Rashad, it was a pleasure seeing you today!

## 2017-08-19 NOTE — Progress Notes (Signed)
Julia Wagner 1970/12/13 [id number]   History:    47 y.o. G2P2L2 Married.  Son is 9, daughter is 66 yo.  RP:  Established patient presenting for annual gyn exam   HPI: Well on Mirena IUD x 02/2013.  No BTB.  No pelvic pain.  Normal vaginal secretions.  No pain with intercourse.    Urine normal/bowel movements normal.  Breasts normal.  Body mass index 27.44.  Physically active and eating well.  Past medical history,surgical history, family history and social history were all reviewed and documented in the EPIC chart.  Gynecologic History No LMP recorded. (Menstrual status: IUD). Contraception: Mirena IUD x 02/2013 Last Pap: 04/2016. Results were: Negative/HPV HR neg Last mammogram: 05/2016. Results were: Negative.  Scheduled for next week. Bone Density: Never Colonoscopy: Never  Obstetric History OB History  Gravida Para Term Preterm AB Living  2 2       2   SAB TAB Ectopic Multiple Live Births               # Outcome Date GA Lbr Len/2nd Weight Sex Delivery Anes PTL Lv  2 Para           1 Para              ROS: A ROS was performed and pertinent positives and negatives are included in the history.  GENERAL: No fevers or chills. HEENT: No change in vision, no earache, sore throat or sinus congestion. NECK: No pain or stiffness. CARDIOVASCULAR: No chest pain or pressure. No palpitations. PULMONARY: No shortness of breath, cough or wheeze. GASTROINTESTINAL: No abdominal pain, nausea, vomiting or diarrhea, melena or bright red blood per rectum. GENITOURINARY: No urinary frequency, urgency, hesitancy or dysuria. MUSCULOSKELETAL: No joint or muscle pain, no back pain, no recent trauma. DERMATOLOGIC: No rash, no itching, no lesions. ENDOCRINE: No polyuria, polydipsia, no heat or cold intolerance. No recent change in weight. HEMATOLOGICAL: No anemia or easy bruising or bleeding. NEUROLOGIC: No headache, seizures, numbness, tingling or weakness. PSYCHIATRIC: No depression, no loss of interest  in normal activity or change in sleep pattern.     Exam:   Ht 5\' 2"  (1.575 m)   Wt 150 lb (68 kg)   BMI 27.44 kg/m   Body mass index is 27.44 kg/m.  General appearance : Well developed well nourished female. No acute distress HEENT: Eyes: no retinal hemorrhage or exudates,  Neck supple, trachea midline, no carotid bruits, no thyroidmegaly Lungs: Clear to auscultation, no rhonchi or wheezes, or rib retractions  Heart: Regular rate and rhythm, no murmurs or gallops Breast:Examined in sitting and supine position were symmetrical in appearance, no palpable masses or tenderness,  no skin retraction, no nipple inversion, no nipple discharge, no skin discoloration, no axillary or supraclavicular lymphadenopathy Abdomen: no palpable masses or tenderness, no rebound or guarding Extremities: no edema or skin discoloration or tenderness  Pelvic: Vulva: Normal             Vagina: No gross lesions or discharge  Cervix: No gross lesions or discharge.  IUD strings felt on exam  Uterus  AV, normal size, shape and consistency, non-tender and mobile  Adnexa  Without masses or tenderness  Anus: Normal   Assessment/Plan:  47 y.o. female for annual exam   1. Well female exam with routine gynecological exam Normal gynecologic exam.  Pap test was negative with negative high-risk HPV on April 2018.  Breast exam normal.  Screening mammogram scheduled for next week.  Health labs with family physician.  2. Encounter for routine checking of intrauterine contraceptive device (IUD) Mirena IUD in good position, well tolerated.  F/U 02/2018 to remove/reinsert a new Mirena IUD.  Princess Bruins MD, 2:40 PM 08/19/2017

## 2017-09-01 ENCOUNTER — Ambulatory Visit
Admission: RE | Admit: 2017-09-01 | Discharge: 2017-09-01 | Disposition: A | Discharge status: Home / Self Care | Payer: 59 | Source: Ambulatory Visit | Attending: Obstetrics & Gynecology | Admitting: Obstetrics & Gynecology

## 2017-09-01 DIAGNOSIS — Z1231 Encounter for screening mammogram for malignant neoplasm of breast: Secondary | ICD-10-CM

## 2017-10-09 ENCOUNTER — Other Ambulatory Visit: Payer: Self-pay | Admitting: Obstetrics & Gynecology

## 2017-11-07 ENCOUNTER — Other Ambulatory Visit: Payer: Self-pay | Admitting: Obstetrics & Gynecology

## 2018-01-11 ENCOUNTER — Ambulatory Visit: Payer: 59 | Admitting: Women's Health

## 2018-01-11 ENCOUNTER — Encounter: Payer: Self-pay | Admitting: Women's Health

## 2018-01-11 VITALS — BP 110/80

## 2018-01-11 DIAGNOSIS — R350 Frequency of micturition: Secondary | ICD-10-CM

## 2018-01-11 NOTE — Progress Notes (Signed)
48 year old MWF G2 P2 presents with complaint of questionable UTI, has had increased urinary frequency, low abdominal bloating for the past 3 days.  States had nocturia x3, which is unusual for her.  Denies burning, pain at end of stream of urination or fever.  Denies vaginal discharge, itching, odor, change in bowel elimination.  Amenorrheic Mirena IUD has appointment scheduled for removal and replacement next month.  Change in routine, works at a [employer].  Thyroid on Synthroid.    Exam: Appears well.  No CVAT.  Abdomen soft, obese, no rebound, radiation or guarding.  External genitalia within normal limits, speculum exam no visible discharge, erythema or odor noted, IUD strings visible.  Bimanual no CMT or adnexal tenderness.   UA: Trace leukocytes, negative nitrites, 6-10 WBCs, 0-5 squamous epithelials, few bacteria  Urinary frequency  Plan: Urine culture pending.  Encouraged increase fluids, avoid caffeine, UTI prevention discussed.

## 2018-01-13 LAB — URINALYSIS, COMPLETE W/RFL CULTURE
Bilirubin Urine: NEGATIVE
Glucose, UA: NEGATIVE
Hgb urine dipstick: NEGATIVE
Hyaline Cast: NONE SEEN /LPF
Ketones, ur: NEGATIVE
Nitrites, Initial: NEGATIVE
Protein, ur: NEGATIVE
RBC / HPF: NONE SEEN /HPF (ref 0–2)
Specific Gravity, Urine: 1.01 (ref 1.001–1.03)
pH: 7 (ref 5.0–8.0)

## 2018-01-13 LAB — CULTURE INDICATED

## 2018-01-13 LAB — URINE CULTURE
MICRO NUMBER:: 33085
SPECIMEN QUALITY:: ADEQUATE

## 2018-01-16 ENCOUNTER — Telehealth: Payer: Self-pay | Admitting: *Deleted

## 2018-01-16 NOTE — Telephone Encounter (Signed)
Patient is not having any burning or pain. She will follow up if needed.

## 2018-01-16 NOTE — Telephone Encounter (Signed)
Since she is having no pain, burning okay to wait until the 24 and have a UA checked.  If she has pain or burning or pain at end of stream of urination or fever best to get the UA prior to January 24.

## 2018-01-16 NOTE — Telephone Encounter (Signed)
Patient called was seen on 01/11/18 for urinary frequency, was told urine culture was negative, has noticed blood in urine, unsure if its coming from IUD due for removal 01/27/18, never had bleeding with Mirena IUD. Last night she noticed increased urgency. Please advise

## 2018-01-27 ENCOUNTER — Ambulatory Visit (INDEPENDENT_AMBULATORY_CARE_PROVIDER_SITE_OTHER): Payer: 59 | Admitting: Obstetrics & Gynecology

## 2018-01-27 ENCOUNTER — Encounter: Payer: Self-pay | Admitting: Obstetrics & Gynecology

## 2018-01-27 VITALS — BP 126/82

## 2018-01-27 DIAGNOSIS — Z30433 Encounter for removal and reinsertion of intrauterine contraceptive device: Secondary | ICD-10-CM | POA: Diagnosis not present

## 2018-01-27 NOTE — Progress Notes (Signed)
    Julia Wagner [birth year] [id number]        48 y.o.  G2P2L2 Married  RP:  Removal/Insertion of Mirena IUD   HPI: Mirena IUD x 02/2013, time to change.  No pelvic pain.  No BTB.  Normal vaginal secretions.   OB History  Gravida Para Term Preterm AB Living  2 2       2   SAB TAB Ectopic Multiple Live Births               # Outcome Date GA Lbr Len/2nd Weight Sex Delivery Anes PTL Lv  2 Para           1 Para             Past medical history,surgical history, problem list, medications, allergies, family history and social history were all reviewed and documented in the EPIC chart.   Directed ROS with pertinent positives and negatives documented in the history of present illness/assessment and plan.  Exam:  Vitals:   01/27/18 0949  BP: 126/82   General appearance:  Normal                                                                    IUD procedure note       Patient presented to the office today for removal and placement of Mirena IUD. The patient had previously been provided with literature information on this method of contraception. The risks benefits and pros and cons were discussed and all her questions were answered. She is fully aware that this form of contraception is 99% effective and is good for 5 years.  Pelvic exam: Vulva normal Vagina: No lesions or discharge Cervix: No lesions or discharge.  IUD strings visible.  Easy removal by pulling on strings with a clamp.  IUD intact, complete, shown to patient and discarded. Uterus: AV position Adnexa: No masses or tenderness Rectal exam: Not done  The cervix was cleansed with Betadine solution. Hurricane spray on the cervix.  A single-tooth tenaculum was placed on the anterior cervical lip. The IUD was shown to the patient and inserted in a sterile fashion.  Hysterometry with the IUD as being inserted was 7 cm.  The IUD string was trimmed. The single-tooth tenaculum was removed. Patient was instructed to return back  to the office in one month for follow up.        Assessment/Plan:  48 y.o. G2P2   1. Encounter for removal and reinsertion of intrauterine contraceptive device (IUD) Time to remove and reinsert a new Mirena IUD after 5 years.  No contraindication to continue with Mirena IUD.  Easy removal of the [name].  [name] inserted without complication, well-tolerated.  Precautions reviewed with patient.  We will follow-up in 4 weeks for IUD check, verify position and rule out infection.   Princess Bruins MD, 9:50 AM 01/27/2018

## 2018-01-27 NOTE — Patient Instructions (Signed)
1. Encounter for removal and reinsertion of intrauterine contraceptive device (IUD) Time to remove and reinsert a new Mirena IUD after 5 years.  No contraindication to continue with Mirena IUD.  Easy removal of the [place] IUD.  [place] IUD inserted without complication, well-tolerated.  Precautions reviewed with patient.  We will follow-up in 4 weeks for IUD check, verify position and rule out infection.  Julia Wagner, it was a pleasure seeing you today!

## 2018-02-06 ENCOUNTER — Encounter: Payer: Self-pay | Admitting: Anesthesiology

## 2018-03-10 ENCOUNTER — Ambulatory Visit: Payer: 59 | Admitting: Obstetrics & Gynecology

## 2018-03-10 ENCOUNTER — Encounter: Payer: Self-pay | Admitting: Obstetrics & Gynecology

## 2018-03-10 VITALS — BP 122/74 | Ht 62.0 in | Wt 169.0 lb

## 2018-03-10 DIAGNOSIS — Z30431 Encounter for routine checking of intrauterine contraceptive device: Secondary | ICD-10-CM | POA: Diagnosis not present

## 2018-03-10 NOTE — Progress Notes (Signed)
    Julia Wagner May 25, 1970 [id number]        48 y.o.  G2P2L2  RP:  Mirena IUD check 4 weeks post insertion  HPI: Doing very well since Mirena IUD insertion on January 27, 2018.  No abnormal bleeding.  No pelvic pain.  Normal vaginal secretions.  No pain with intercourse.  No fever.   OB History  Gravida Para Term Preterm AB Living  2 2       2   SAB TAB Ectopic Multiple Live Births               # Outcome Date GA Lbr Len/2nd Weight Sex Delivery Anes PTL Lv  2 Para           1 Para             Past medical history,surgical history, problem list, medications, allergies, family history and social history were all reviewed and documented in the EPIC chart.   Directed ROS with pertinent positives and negatives documented in the history of present illness/assessment and plan.  Exam:  Vitals:   03/10/18 0941  BP: 122/74  Weight: 169 lb (76.7 kg)  Height: 5\' 2"  (1.575 m)   General appearance:  Normal  Abdomen: Normal  Gynecologic exam: Vulva normal.  Speculum: Cervix normal with no erythema.  IUD strings visible at the exocervix.  Normal vaginal secretions.  Normal vagina.   Assessment/Plan:  48 y.o. G2P2   1. Encounter for routine checking of intrauterine contraceptive device (IUD) Mirena IUD very well tolerated, in good position with no sign of infection.  Patient reassured.  Follow-up annual gynecologic exam August 2020.  Counseling on above issues and coordination of care more than 50% for 15 minutes.  Princess Bruins MD, 9:54 AM 03/10/2018

## 2018-03-10 NOTE — Patient Instructions (Signed)
1. Encounter for routine checking of intrauterine contraceptive device (IUD) Mirena IUD very well tolerated, in good position with no sign of infection.  Patient reassured.  Follow-up annual gynecologic exam August 2020.  Jaicee, it was a pleasure seeing you today!

## 2018-08-24 ENCOUNTER — Other Ambulatory Visit: Payer: Self-pay

## 2018-08-25 ENCOUNTER — Encounter: Payer: Self-pay | Admitting: Obstetrics & Gynecology

## 2018-08-25 ENCOUNTER — Ambulatory Visit (INDEPENDENT_AMBULATORY_CARE_PROVIDER_SITE_OTHER): Payer: 59 | Admitting: Obstetrics & Gynecology

## 2018-08-25 VITALS — BP 142/92 | Ht 62.0 in | Wt 177.0 lb

## 2018-08-25 DIAGNOSIS — Z30431 Encounter for routine checking of intrauterine contraceptive device: Secondary | ICD-10-CM | POA: Diagnosis not present

## 2018-08-25 DIAGNOSIS — Z01419 Encounter for gynecological examination (general) (routine) without abnormal findings: Secondary | ICD-10-CM

## 2018-08-25 DIAGNOSIS — E6609 Other obesity due to excess calories: Secondary | ICD-10-CM | POA: Diagnosis not present

## 2018-08-25 DIAGNOSIS — Z6832 Body mass index (BMI) 32.0-32.9, adult: Secondary | ICD-10-CM | POA: Diagnosis not present

## 2018-08-25 NOTE — Progress Notes (Signed)
Julia Wagner [birth year] [id number]   History:    48 y.o. G2P2L2 Married.  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  Daughter Museum/gallery exhibitions officer at [employer].  RP:  Established patient presenting for annual gyn exam   HPI: Well on Mirena IUD x 01/2018.  No menses, no BTB.  No pelvic pain.  No pain with intercourse.  Urine and bowel movements normal.  Breasts normal.  Body mass index 32.37.  Needs to increase physical activities and lower calories and nutrition.  Health labs here today.  Past medical history,surgical history, family history and social history were all reviewed and documented in the EPIC chart.  Gynecologic History No LMP recorded. (Menstrual status: IUD). Contraception: Mirena IUD x 01/2018 Last Pap: 04/2016. Results were: Negative Last mammogram: 08/2017. Results were: Negative Bone Density: Never Colonoscopy: Never  Obstetric History OB History  Gravida Para Term Preterm AB Living  _0 SAB TAB Ectopic Multiple Live Births               # Outcome Date GA Lbr Len/2nd Weight Sex Delivery Anes PTL Lv  2 Para           1 Para              ROS: A ROS was performed and pertinent positives and negatives are included in the history.  GENERAL: No fevers or chills. HEENT: No change in vision, no earache, sore throat or sinus congestion. NECK: No pain or stiffness. CARDIOVASCULAR: No chest pain or pressure. No palpitations. PULMONARY: No shortness of breath, cough or wheeze. GASTROINTESTINAL: No abdominal pain, nausea, vomiting or diarrhea, melena or bright red blood per rectum. GENITOURINARY: No urinary frequency, urgency, hesitancy or dysuria. MUSCULOSKELETAL: No joint or muscle pain, no back pain, no recent trauma. DERMATOLOGIC: No rash, no itching, no lesions. ENDOCRINE: No polyuria, polydipsia, no heat or cold intolerance. No recent change in weight. HEMATOLOGICAL: No anemia or easy bruising or bleeding. NEUROLOGIC: No headache, seizures, numbness, tingling or weakness. PSYCHIATRIC:  No depression, no loss of interest in normal activity or change in sleep pattern.     Exam:   BP (!) 142/92   Ht _1  (1.575 m)   Wt 177 lb (80.3 kg)   BMI 32.37 kg/m   Body mass index is 32.37 kg/m.  General appearance : Well developed well nourished female. No acute distress HEENT: Eyes: no retinal hemorrhage or exudates,  Neck supple, trachea midline, no carotid bruits, no thyroidmegaly Lungs: Clear to auscultation, no rhonchi or wheezes, or rib retractions  Heart: Regular rate and rhythm, no murmurs or gallops Breast:Examined in sitting and supine position were symmetrical in appearance, no palpable masses or tenderness,  no skin retraction, no nipple inversion, no nipple discharge, no skin discoloration, no axillary or supraclavicular lymphadenopathy Abdomen: no palpable masses or tenderness, no rebound or guarding Extremities: no edema or skin discoloration or tenderness  Pelvic: Vulva: Normal             Vagina: No gross lesions or discharge  Cervix: No gross lesions or discharge.  [facility] strings [facility] at [facility].  Pap reflex done.  Uterus  AV, normal size, shape and consistency, non-tender and mobile  Adnexa  Without masses or tenderness  Anus: Normal   Assessment/Plan:  48 y.o. female for annual exam   1. Encounter for routine gynecological examination with Papanicolaou smear of cervix Normal gynecologic exam.  Pap reflex done.  Breast exam normal.  Screening mammogram August 2019 was negative, will schedule again now.  Fasting health labs here today. - CBC - Comp Met (CMET) - TSH - Lipid panel - VITAMIN D 25 Hydroxy (Vit-D Deficiency, Fractures)  2. Encounter for routine checking of intrauterine contraceptive device (IUD) Well on Mirena IUD since insertion January 2020.  IUD in good position.  3. Class 1 obesity due to excess calories without serious comorbidity with body mass index (BMI) of 32.0 to 32.9 in adult Recommend a lower calorie/carb diet such as [employer].  Aerobic physical activities 5 times a week and weight lifting every 2 days.  Princess Bruins MD, 10:21 AM 08/25/2018

## 2018-08-26 LAB — CBC
HCT: 40.4 % (ref 35.0–45.0)
Hemoglobin: 13.8 g/dL (ref 11.7–15.5)
MCH: 31.1 pg (ref 27.0–33.0)
MCHC: 34.2 g/dL (ref 32.0–36.0)
MCV: 91 fL (ref 80.0–100.0)
MPV: 9.6 fL (ref 7.5–12.5)
Platelets: 300 10*3/uL (ref 140–400)
RBC: 4.44 10*6/uL (ref 3.80–5.10)
RDW: 12.5 % (ref 11.0–15.0)
WBC: 8.6 10*3/uL (ref 3.8–10.8)

## 2018-08-26 LAB — COMPREHENSIVE METABOLIC PANEL
AG Ratio: 1.8 (calc) (ref 1.0–2.5)
ALT: 12 U/L (ref 6–29)
AST: 20 U/L (ref 10–35)
Albumin: 4.1 g/dL (ref 3.6–5.1)
Alkaline phosphatase (APISO): 60 U/L (ref 31–125)
BUN: 10 mg/dL (ref 7–25)
CO2: 23 mmol/L (ref 20–32)
Calcium: 9.4 mg/dL (ref 8.6–10.2)
Chloride: 104 mmol/L (ref 98–110)
Creat: 0.58 mg/dL (ref 0.50–1.10)
Globulin: 2.3 g/dL (calc) (ref 1.9–3.7)
Glucose, Bld: 87 mg/dL (ref 65–99)
Potassium: 4.9 mmol/L (ref 3.5–5.3)
Sodium: 137 mmol/L (ref 135–146)
Total Bilirubin: 1.9 mg/dL — ABNORMAL HIGH (ref 0.2–1.2)
Total Protein: 6.4 g/dL (ref 6.1–8.1)

## 2018-08-26 LAB — TSH: TSH: 5.1 mIU/L — ABNORMAL HIGH

## 2018-08-26 LAB — VITAMIN D 25 HYDROXY (VIT D DEFICIENCY, FRACTURES): Vit D, 25-Hydroxy: 13 ng/mL — ABNORMAL LOW (ref 30–100)

## 2018-08-26 LAB — LIPID PANEL
Cholesterol: 177 mg/dL (ref <200)
HDL: 49 mg/dL — ABNORMAL LOW (ref >=50)
LDL Cholesterol (Calc): 111 mg/dL (calc) — ABNORMAL HIGH
Non-HDL Cholesterol (Calc): 128 mg/dL (calc) (ref <130)
Total CHOL/HDL Ratio: 3.6 (calc) (ref <5.0)
Triglycerides: 84 mg/dL (ref <150)

## 2018-08-28 LAB — PAP IG W/ RFLX HPV ASCU

## 2018-09-02 ENCOUNTER — Encounter: Payer: Self-pay | Admitting: Obstetrics & Gynecology

## 2018-09-02 NOTE — Patient Instructions (Signed)
1. Encounter for routine gynecological examination with Papanicolaou smear of cervix Normal gynecologic exam.  Pap reflex done.  Breast exam normal.  Screening mammogram August 2019 was negative, will schedule again now.  Fasting health labs here today. - CBC - Comp Met (CMET) - TSH - Lipid panel - VITAMIN D 25 Hydroxy (Vit-D Deficiency, Fractures)  2. Encounter for routine checking of intrauterine contraceptive device (IUD) Well on Mirena IUD since insertion January 2020.  IUD in good position.  3. Class 1 obesity due to excess calories without serious comorbidity with body mass index (BMI) of 32.0 to 32.9 in adult Recommend a lower calorie/carb diet such as [employer].  Aerobic physical activities 5 times a week and weight lifting every 2 days.  Julia Wagner, it was a pleasure seeing you today!  I will inform you of your results as soon as they are available.

## 2018-09-05 ENCOUNTER — Other Ambulatory Visit: Payer: Self-pay | Admitting: Obstetrics & Gynecology

## 2018-09-05 DIAGNOSIS — Z1231 Encounter for screening mammogram for malignant neoplasm of breast: Secondary | ICD-10-CM

## 2018-09-08 ENCOUNTER — Encounter: Payer: Self-pay | Admitting: Family Medicine

## 2018-09-08 ENCOUNTER — Ambulatory Visit: Payer: 59 | Admitting: Family Medicine

## 2018-09-08 ENCOUNTER — Other Ambulatory Visit: Payer: Self-pay

## 2018-09-08 VITALS — BP 110/68 | HR 83 | Temp 96.3°F | Resp 16 | Ht 62.0 in | Wt 178.4 lb

## 2018-09-08 DIAGNOSIS — R7989 Other specified abnormal findings of blood chemistry: Secondary | ICD-10-CM | POA: Diagnosis not present

## 2018-09-08 DIAGNOSIS — E039 Hypothyroidism, unspecified: Secondary | ICD-10-CM | POA: Diagnosis not present

## 2018-09-08 DIAGNOSIS — Z23 Encounter for immunization: Secondary | ICD-10-CM | POA: Diagnosis not present

## 2018-09-08 DIAGNOSIS — R17 Unspecified jaundice: Secondary | ICD-10-CM

## 2018-09-08 LAB — HEPATIC FUNCTION PANEL
ALT: 11 U/L (ref 0–35)
AST: 16 U/L (ref 0–37)
Albumin: 4 g/dL (ref 3.5–5.2)
Alkaline Phosphatase: 66 U/L (ref 39–117)
Bilirubin, Direct: 0.3 mg/dL (ref 0.0–0.3)
Total Bilirubin: 1.8 mg/dL — ABNORMAL HIGH (ref 0.2–1.2)
Total Protein: 6.5 g/dL (ref 6.0–8.3)

## 2018-09-08 MED ORDER — LEVOTHYROXINE SODIUM 75 MCG PO TABS: 75.0000 ug | ORAL_TABLET | Freq: Every day | ORAL | 1 refills | Status: DC

## 2018-09-08 MED ORDER — VITAMIN D (ERGOCALCIFEROL) 1.25 MG (50000 UNIT) PO CAPS: 50000.0000 [IU] | ORAL_CAPSULE | ORAL | 0 refills | Status: DC

## 2018-09-08 NOTE — Progress Notes (Signed)
Subjective:    Patient ID: Julia Wagner, female    DOB: [birth year], 48 y.o.   MRN: [id number]  HPI   Patient presents to clinic to establish PCP and discuss lab work that was performed by GYN.  Lab work did reveal a TSH of 5.1, currently on levothyroxine 50 mcg/day.  Also labs did reveal a low vitamin D level at 13.  Also did show elevated bilirubin at 1.9, per patient she has had elevated bilirubin before but has never had ultrasound of the area.  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  Overall she feels well.  Denies any acute issues at this time.  Past medical, social family history reviewed and updated accordingly in chart.    Women's health exams are up-to-date, sees GYN regularly.  Declines flu vaccine today, states she will think about it.  Is agreeable to Tdap today, unsure of last TDAP.  Patient Active Problem List   Diagnosis Date Noted  . Ovarian cyst, left 01/11/2014  . 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 Ovarian cyst, right 11/02/2013  . Encounter for insertion of mirena IUD 02/06/2013  . Nonspecific abnormal results of liver function study 09/11/2012  . Eczema   . Hypothyroidism 10/02/2010  . IUD 10/02/2010   Social History   Tobacco Use  . Smoking status: Never Smoker  . Smokeless tobacco: Never Used  Substance Use Topics  . Alcohol use: Yes    Alcohol/week: 0.0 standard drinks    Comment: Rarely   Past Surgical History:  Procedure Laterality Date  . DILATATION & CURETTAGE/HYSTEROSCOPY WITH TRUECLEAR N/A 09/13/2012   Procedure: DILATATION & CURETTAGE/HYSTEROSCOPY TRUCLEAR RESECTOSCOPIC MYOMECTOMY, ;  Surgeon: Terrance Mass, MD;  Location: [facility];  Service: Gynecology;  Laterality: N/A;  TRUCLEAR RESECTOSCOPIC MYOMECTOMY, REMOVAL OF IUD   . IUD REMOVAL N/A 09/13/2012   Procedure: INTRAUTERINE DEVICE (IUD) REMOVAL;  Surgeon: Terrance Mass, MD;  Location: [facility];  Service:  Gynecology;  Laterality: N/A;  . Mirena     Inserted 02-06-13  . MYOMECTOMY    . Removal of fibroma     Family History  Problem Relation Age of Onset  . Hypertension Mother   . Colon polyps Mother   . Diabetes Father   . Hypertension Father   . Heart disease Father   . Kidney disease Father   . Heart disease Paternal Grandfather   . Heart disease Paternal Grandmother   . Diabetes Maternal Grandmother   . Colon cancer Neg Hx        ?  . Esophageal cancer Neg Hx      Review of Systems  Constitutional: Negative for chills, fatigue and fever.  HENT: Negative for congestion, ear pain, sinus pain and sore throat.   Eyes: Negative.   Respiratory: Negative for cough, shortness of breath and wheezing.   Cardiovascular: Negative for chest pain, palpitations and leg swelling.  Gastrointestinal: Negative for abdominal pain, diarrhea, nausea and vomiting.  Genitourinary: Negative for dysuria, frequency and urgency.  Musculoskeletal: Negative for arthralgias and myalgias.  Skin: Negative for color change, pallor and rash.  Neurological: Negative for syncope, light-headedness and headaches.  Psychiatric/Behavioral: The patient is not nervous/anxious.       Objective:   Physical Exam Vitals signs and nursing note reviewed.  Constitutional:      General: She is not in acute distress.    Appearance: Normal appearance. She is not toxic-appearing.  HENT:     Head: Normocephalic and atraumatic.  Eyes:     General: No scleral icterus.    Extraocular Movements: Extraocular movements intact.     Conjunctiva/sclera: Conjunctivae normal.     Pupils: Pupils are equal, round, and reactive to light.  Neck:     Musculoskeletal: Normal range of motion and neck supple. No neck rigidity or muscular tenderness.     Thyroid: No thyroid mass, thyromegaly or thyroid tenderness.  Cardiovascular:     Rate and Rhythm: Normal rate and regular rhythm.     Heart sounds: Normal heart sounds.  Pulmonary:      Effort: Pulmonary effort is normal. No respiratory distress.     Breath sounds: Normal breath sounds.  Musculoskeletal:     Right lower leg: No edema.     Left lower leg: No edema.  Skin:    General: Skin is warm and dry.     Coloration: Skin is not jaundiced or pale.  Neurological:     General: No focal deficit present.     Mental Status: She is alert and oriented to person, place, and time.     Gait: Gait normal.  Psychiatric:        Mood and Affect: Mood normal.        Behavior: Behavior normal.        Thought Content: Thought content normal.        Judgment: Judgment normal.       Today's Vitals   09/08/18 0847  BP: 110/68  Pulse: 83  Resp: 16  Temp: (!) 96.3 F (35.7 C)  TempSrc: Temporal  SpO2: 98%  Weight: 178 lb 6.4 oz (80.9 kg)  Height: 5\' 2"  (1.575 m)   Body mass index is 32.63 kg/m.     Assessment & Plan:    1. Acquired hypothyroidism   We will increase synthroid to 75 mcg perday and plan to repeat TSH level when we recheck Vit D  - levothyroxine (SYNTHROID) 75 MCG tablet; Take 1 tablet (75 mcg total) by mouth daily.  Dispense: 90 tablet; Refill: 1  2. Gilbert's syndrome/Elevated bilirubin  Will follow up with hepatic panel and do Liver US  - Hepatic function panel - US Abdomen Limited; Future   3. Low vitamin D level Will take vitamin D weekly, we will plan to recheck Vit D levels in 10-12 weeks.  - Vitamin D, Ergocalciferol, (DRISDOL) 1.25 MG (50000 UT) CAPS capsule; Take 1 capsule (50,000 Units total) by mouth every 7 (seven) days.  Dispense: 12 capsule; Refill: 0  4. Need for tetanus booster  - Tdap vaccine greater than or equal to 7yo IM   Patient will return to clinic in about 10 to 12 weeks for lab work recheck to follow-up on vitamin D and thyroid levels.  She is aware someone will reach out to her in regards to ultrasound of abdomen appointment.  Patient advised to let us know if she does decide to get a flu vaccine we will get  her scheduled.  Advised to call office anytime with questions or concerns.

## 2018-09-18 ENCOUNTER — Encounter: Payer: Self-pay | Admitting: Lab

## 2018-09-21 ENCOUNTER — Ambulatory Visit: Payer: 59

## 2018-09-26 ENCOUNTER — Encounter: Payer: Self-pay | Admitting: Gynecology

## 2018-09-29 ENCOUNTER — Ambulatory Visit
Admission: RE | Admit: 2018-09-29 | Discharge: 2018-09-29 | Disposition: A | Discharge status: Home / Self Care | Payer: 59 | Source: Ambulatory Visit | Attending: Family Medicine | Admitting: Family Medicine

## 2018-09-29 ENCOUNTER — Encounter: Payer: Self-pay | Admitting: *Deleted

## 2018-09-29 ENCOUNTER — Other Ambulatory Visit: Payer: Self-pay

## 2018-09-29 DIAGNOSIS — R17 Unspecified jaundice: Secondary | ICD-10-CM | POA: Diagnosis present

## 2018-09-29 NOTE — Telephone Encounter (Signed)
This encounter was created in error - please disregard.

## 2018-10-19 ENCOUNTER — Ambulatory Visit
Admission: RE | Admit: 2018-10-19 | Discharge: 2018-10-19 | Disposition: A | Discharge status: Home / Self Care | Payer: 59 | Source: Ambulatory Visit | Attending: Obstetrics & Gynecology | Admitting: Obstetrics & Gynecology

## 2018-10-19 ENCOUNTER — Other Ambulatory Visit: Payer: Self-pay

## 2018-10-19 DIAGNOSIS — Z1231 Encounter for screening mammogram for malignant neoplasm of breast: Secondary | ICD-10-CM

## 2018-11-23 ENCOUNTER — Other Ambulatory Visit: Payer: Self-pay | Admitting: Family Medicine

## 2018-11-23 DIAGNOSIS — R7989 Other specified abnormal findings of blood chemistry: Secondary | ICD-10-CM

## 2019-03-12 ENCOUNTER — Telehealth: Payer: Self-pay | Admitting: Family Medicine

## 2019-03-12 DIAGNOSIS — E039 Hypothyroidism, unspecified: Secondary | ICD-10-CM

## 2019-03-12 MED ORDER — LEVOTHYROXINE SODIUM 75 MCG PO TABS: 75.0000 ug | ORAL_TABLET | Freq: Every day | ORAL | 1 refills | Status: DC

## 2019-03-12 NOTE — Telephone Encounter (Signed)
Pt needs levothyroxine prescription filled. She is scheduled for TOC appt with Jerelene Redden on 03/23/19.

## 2019-03-23 ENCOUNTER — Encounter: Payer: 59 | Admitting: Nurse Practitioner

## 2019-08-03 ENCOUNTER — Other Ambulatory Visit: Payer: Self-pay

## 2019-08-03 ENCOUNTER — Encounter: Payer: Self-pay | Admitting: Nurse Practitioner

## 2019-08-03 ENCOUNTER — Ambulatory Visit: Payer: 59 | Admitting: Nurse Practitioner

## 2019-08-03 ENCOUNTER — Ambulatory Visit (INDEPENDENT_AMBULATORY_CARE_PROVIDER_SITE_OTHER): Payer: 59

## 2019-08-03 VITALS — BP 112/80 | HR 79 | Temp 97.6°F | Ht 62.0 in | Wt 195.0 lb

## 2019-08-03 DIAGNOSIS — M79605 Pain in left leg: Secondary | ICD-10-CM

## 2019-08-03 DIAGNOSIS — Z114 Encounter for screening for human immunodeficiency virus [HIV]: Secondary | ICD-10-CM

## 2019-08-03 DIAGNOSIS — Z6835 Body mass index (BMI) 35.0-35.9, adult: Secondary | ICD-10-CM | POA: Diagnosis not present

## 2019-08-03 DIAGNOSIS — E039 Hypothyroidism, unspecified: Secondary | ICD-10-CM | POA: Diagnosis not present

## 2019-08-03 DIAGNOSIS — M545 Low back pain, unspecified: Secondary | ICD-10-CM | POA: Insufficient documentation

## 2019-08-03 DIAGNOSIS — Z1159 Encounter for screening for other viral diseases: Secondary | ICD-10-CM

## 2019-08-03 DIAGNOSIS — R7989 Other specified abnormal findings of blood chemistry: Secondary | ICD-10-CM

## 2019-08-03 DIAGNOSIS — R17 Unspecified jaundice: Secondary | ICD-10-CM

## 2019-08-03 LAB — CBC WITH DIFFERENTIAL/PLATELET
Basophils Absolute: 0.1 10*3/uL (ref 0.0–0.1)
Basophils Relative: 1.1 % (ref 0.0–3.0)
Eosinophils Absolute: 0.2 10*3/uL (ref 0.0–0.7)
Eosinophils Relative: 2.4 % (ref 0.0–5.0)
HCT: 40.6 % (ref 36.0–46.0)
Hemoglobin: 13.7 g/dL (ref 12.0–15.0)
Lymphocytes Relative: 24.4 % (ref 12.0–46.0)
Lymphs Abs: 1.7 10*3/uL (ref 0.7–4.0)
MCHC: 33.8 g/dL (ref 30.0–36.0)
MCV: 91.4 fl (ref 78.0–100.0)
Monocytes Absolute: 0.6 10*3/uL (ref 0.1–1.0)
Monocytes Relative: 8.5 % (ref 3.0–12.0)
Neutro Abs: 4.4 10*3/uL (ref 1.4–7.7)
Neutrophils Relative %: 63.6 % (ref 43.0–77.0)
Platelets: 342 10*3/uL (ref 150.0–400.0)
RBC: 4.44 Mil/uL (ref 3.87–5.11)
RDW: 13.7 % (ref 11.5–15.5)
WBC: 7 10*3/uL (ref 4.0–10.5)

## 2019-08-03 LAB — B12 AND FOLATE PANEL
Folate: 20.1 ng/mL (ref >5.9)
Vitamin B-12: >1526 pg/mL — ABNORMAL HIGH (ref 211–911)

## 2019-08-03 LAB — LIPID PANEL
Cholesterol: 191 mg/dL (ref 0–200)
HDL: 44.9 mg/dL (ref >39.00)
LDL Cholesterol: 122 mg/dL — ABNORMAL HIGH (ref 0–99)
NonHDL: 145.95
Total CHOL/HDL Ratio: 4
Triglycerides: 119 mg/dL (ref 0.0–149.0)
VLDL: 23.8 mg/dL (ref 0.0–40.0)

## 2019-08-03 LAB — COMPREHENSIVE METABOLIC PANEL
ALT: 24 U/L (ref 0–35)
AST: 24 U/L (ref 0–37)
Albumin: 4 g/dL (ref 3.5–5.2)
Alkaline Phosphatase: 75 U/L (ref 39–117)
BUN: 9 mg/dL (ref 6–23)
CO2: 26 mEq/L (ref 19–32)
Calcium: 8.8 mg/dL (ref 8.4–10.5)
Chloride: 105 mEq/L (ref 96–112)
Creatinine, Ser: 0.57 mg/dL (ref 0.40–1.20)
GFR: 112.37 mL/min (ref >60.00)
Glucose, Bld: 96 mg/dL (ref 70–99)
Potassium: 4.1 mEq/L (ref 3.5–5.1)
Sodium: 137 mEq/L (ref 135–145)
Total Bilirubin: 1.5 mg/dL — ABNORMAL HIGH (ref 0.2–1.2)
Total Protein: 6.3 g/dL (ref 6.0–8.3)

## 2019-08-03 LAB — TSH: TSH: 2.96 u[IU]/mL (ref 0.35–4.50)

## 2019-08-03 LAB — HEMOGLOBIN A1C: Hgb A1c MFr Bld: 5.6 % (ref 4.6–6.5)

## 2019-08-03 LAB — VITAMIN D 25 HYDROXY (VIT D DEFICIENCY, FRACTURES): VITD: 36.38 ng/mL (ref 30.00–100.00)

## 2019-08-03 MED ORDER — MELOXICAM 7.5 MG PO TABS: 7.5000 mg | ORAL_TABLET | Freq: Every day | ORAL | 0 refills | Status: DC

## 2019-08-03 NOTE — Patient Instructions (Addendum)
Please go to the lab today.  Have ordered x-rays of the lumbar spine for your back pain leg.  Pending results, will consider referral to physical therapy.    Trial of Mobic 7.5 mg 1 tablet daily for 2 weeks. Hold on the Advil/Aleve while taking this.   Continue with healthy lifestyle as you are doing a weight loss goals of 10% over the next 6 months.  See low glycemic index sheet.  We recommend weight watchers as well.  A diet rich in vegetables, lean protein, nonfat dairy, fruits, and low glycemic index complex carbs is ideal.  Arrange follow-up.  You are due for a mammogram in October.  Acute Back Pain, Adult Acute back pain is sudden and usually short-lived. It is often caused by an injury to the muscles and tissues in the back. The injury may result from:  A muscle or ligament getting overstretched or torn (strained). Ligaments are tissues that connect bones to each other. Lifting something improperly can cause a back strain.  Wear and tear (degeneration) of the spinal disks. Spinal disks are circular tissue that provides cushioning between the bones of the spine (vertebrae).  Twisting motions, such as while playing sports or doing yard work.  A hit to the back.  Arthritis. You may have a physical exam, lab tests, and imaging tests to find the cause of your pain. Acute back pain usually goes away with rest and home care. Follow these instructions at home: Managing pain, stiffness, and swelling  Take over-the-counter and prescription medicines only as told by your health care provider.  Your health care provider may recommend applying ice during the first 24-48 hours after your pain starts. To do this: ? Put ice in a plastic bag. ? Place a towel between your skin and the bag. ? Leave the ice on for 20 minutes, 2-3 times a day.  If directed, apply heat to the affected area as often as told by your health care provider. Use the heat source that your health care provider recommends,  such as a moist heat pack or a heating pad. ? Place a towel between your skin and the heat source. ? Leave the heat on for 20-30 minutes. ? Remove the heat if your skin turns bright red. This is especially important if you are unable to feel pain, heat, or cold. You have a greater risk of getting burned. Activity   Do not stay in bed. Staying in bed for more than 1-2 days can delay your recovery.  Sit up and stand up straight. Avoid leaning forward when you sit, or hunching over when you stand. ? If you work at a desk, sit close to it so you do not need to lean over. Keep your chin tucked in. Keep your neck drawn back, and keep your elbows bent at a right angle. Your arms should look like the letter "L." ? Sit high and close to the steering wheel when you drive. Add lower back (lumbar) support to your car seat, if needed.  Take short walks on even surfaces as soon as you are able. Try to increase the length of time you walk each day.  Do not sit, drive, or stand in one place for more than 30 minutes at a time. Sitting or standing for long periods of time can put stress on your back.  Do not drive or use heavy machinery while taking prescription pain medicine.  Use proper lifting techniques. When you bend and lift, use positions  that put less stress on your back: ? [place] your knees. ? Keep the load close to your body. ? Avoid twisting.  Exercise regularly as told by your health care provider. Exercising helps your back heal faster and helps prevent back injuries by keeping muscles strong and flexible.  Work with a physical therapist to make a safe exercise program, as recommended by your health care provider. Do any exercises as told by your physical therapist. Lifestyle  Maintain a healthy weight. Extra weight puts stress on your back and makes it difficult to have good posture.  Avoid activities or situations that make you feel anxious or stressed. Stress and anxiety increase muscle  tension and can make back pain worse. Learn ways to manage anxiety and stress, such as through exercise. General instructions  Sleep on a firm mattress in a comfortable position. Try lying on your side with your knees slightly bent. If you lie on your back, put a pillow under your knees.  Follow your treatment plan as told by your health care provider. This may include: ? Cognitive or behavioral therapy. ? Acupuncture or massage therapy. ? Meditation or yoga. Contact a health care provider if:  You have pain that is not relieved with rest or medicine.  You have increasing pain going down into your legs or buttocks.  Your pain does not improve after 2 weeks.  You have pain at night.  You lose weight without trying.  You have a fever or chills. Get help right away if:  You develop new bowel or bladder control problems.  You have unusual weakness or numbness in your arms or legs.  You develop nausea or vomiting.  You develop abdominal pain.  You feel faint. Summary  Acute back pain is sudden and usually short-lived.  Use proper lifting techniques. When you bend and lift, use positions that put less stress on your back.  Take over-the-counter and prescription medicines and apply heat or ice as directed by your health care provider. This information is not intended to replace advice given to you by your health care provider. Make sure you discuss any questions you have with your health care provider. Document Revised: 04/11/2018 Document Reviewed: 08/04/2016 Elsevier Patient Education  [place].

## 2019-08-03 NOTE — Progress Notes (Signed)
Established Patient Office Visit  Subjective:  Patient ID: Julia Wagner, female    DOB: 1970-04-12  Age: 49 y.o. MRN: [id number]  CC:  Chief Complaint  Patient presents with  . Transitions Of Care    labs and nerve pain    HPI Julia Wagner presents for to establish care with new primary care provider.  She last saw Lauren on 09/08/2018 for hypothyroidism.  She is requesting repeat laboratory studies.  Acquired hypothyroidism: She is maintained on levothyroxine 75 mcg daily.  She is tolerating medication well.  She has no heat or cold intolerance, dry skin or hair, problems with constipation, weight gain.  Lab Results  Component Value Date   TSH 5.10 (H) 08/25/2018   Gilbert's syndrome/elevated bilirubin: Previously checked with liver panel and RUQ ultrasound-negative.  She has no report right upper quadrant pain.  Low vitamin D level: Previously recheck. Low B12: Patient is taking B12 supplements.  Bilateral plantar fasciitis:  rt >lt- seeing Podiatry and better with Brooks shoes and fitted for insert.   Chronic back pain: Left leg gets irritated at night. Always hurt some in am to bend over, or catch in lower back. No back pain now. Walking 2 miles every other , elliptical, bike, upper body weights. She does not know if she annoyed her left leg nerve and notices intermittent pain down  left leg- not sharp and infrequent-mostly at night and gets comfortable on right side or back and then sleeps.  She has taken Aleve or Motrin and it helps. No leg numbness or tingling or weakness.No saddle numbness.  Immunizations:Tdap UTD, Covid J&J 04/10/2019 Diet:yes Exercise: yes Colonoscopy: due in Oct age 37 Dexa: Pap Smear: done 08/25/18 -normal,  Mammogram: 10/15020    Past Medical History:  Diagnosis Date  . Anxiety   . Eczema   . Fibroid, uterine   . Hypothyroidism   . IUD 09/04/2010   Mirena IUD  . Kidney stones     Past Surgical History:  Procedure Laterality Date  .  DILATATION & CURETTAGE/HYSTEROSCOPY WITH TRUECLEAR N/A 09/13/2012   Procedure: DILATATION & CURETTAGE/HYSTEROSCOPY TRUCLEAR RESECTOSCOPIC MYOMECTOMY, ;  Surgeon: Terrance Mass, MD;  Location: [facility];  Service: Gynecology;  Laterality: N/A;  TRUCLEAR RESECTOSCOPIC MYOMECTOMY, REMOVAL OF IUD   . IUD REMOVAL N/A 09/13/2012   Procedure: INTRAUTERINE DEVICE (IUD) REMOVAL;  Surgeon: Terrance Mass, MD;  Location: [facility];  Service: Gynecology;  Laterality: N/A;  . Mirena     Inserted 02-06-13  . MYOMECTOMY    . Removal of fibroma      Family History  Problem Relation Age of Onset  . Hypertension Mother   . Colon polyps Mother   . Diabetes Father   . Hypertension Father   . Heart disease Father   . Kidney disease Father   . Heart disease Paternal Grandfather   . Heart disease Paternal Grandmother   . Diabetes Maternal Grandmother   . Colon cancer Neg Hx        ?  . Esophageal cancer Neg Hx     Social History   Socioeconomic History  . Marital status: Married    Spouse name: Not on file  . Number of children: 2  . Years of education: Not on file  . Highest education level: Not on file  Occupational History  . Occupation: BUSINESS ASSIST    Employer: [employer]  Tobacco Use  . Smoking status: Never Smoker  . Smokeless tobacco: Never Used  Vaping Use  . Vaping Use: Never used  Substance and Sexual Activity  . Alcohol use: Yes    Alcohol/week: 0.0 standard drinks    Comment: Rarely  . Drug use: No  . Sexual activity: Yes    Birth control/protection: I.U.D.    Comment: Mirena IUD 02-06-13  Other Topics Concern  . 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   Social Determinants of Health   Financial Resource Strain:   . Difficulty of Paying Living Expenses:   Food Insecurity:   . Worried About Charity fundraiser in the Last Year:   . Arboriculturist in the  Last Year:   Transportation Needs:   . Film/video editor (Medical):   Marland Kitchen Lack of Transportation (Non-Medical):   Physical Activity:   . Days of Exercise per Week:   . Minutes of Exercise per Session:   Stress:   . Feeling of Stress :   Social Connections:   . Frequency of Communication with Friends and Family:   . Frequency of Social Gatherings with Friends and Family:   . Attends Religious Services:   . Active Member of Clubs or Organizations:   . Attends Archivist Meetings:   Marland Kitchen Marital Status:   Intimate Partner Violence:   . Fear of Current or Ex-Partner:   . Emotionally Abused:   Marland Kitchen Physically Abused:   . Sexually Abused:     Outpatient Medications Prior to Visit  Medication Sig Dispense Refill  . levothyroxine (SYNTHROID) 75 MCG tablet Take 1 tablet (75 mcg total) by mouth daily. 90 tablet 1  . loratadine (CLARITIN) 10 MG tablet Take 10 mg by mouth daily.    . vitamin B-12 (CYANOCOBALAMIN) 1000 MCG tablet Take 1,000 mcg by mouth daily.    . Vitamin D, Ergocalciferol, (DRISDOL) 1.25 MG (50000 UT) CAPS capsule Take 1 capsule (50,000 Units total) by mouth every 7 (seven) days. Another 3 months then stop and take D3 4000 IU daily over the counter 12 capsule 0   Facility-Administered Medications Prior to Visit  Medication Dose Route Frequency Provider Last Rate Last Admin  . levonorgestrel (MIRENA) 20 MCG/24HR IUD   Intrauterine Once Terrance Mass, MD        No Known Allergies  ROS Review of Systems  Constitutional: Negative for chills and fever.  HENT: Negative.   Eyes: Negative.   Respiratory: Negative.   Cardiovascular: Negative.   Gastrointestinal: Negative.   Endocrine: Negative.   Genitourinary: Negative.   Musculoskeletal:       Positive per HPI  Allergic/Immunologic: Negative.   Neurological: Negative.   Hematological: Negative.   Psychiatric/Behavioral:       No concerns about depression/anxiety, Stretched thin, but doing OK.         Objective:    Physical Exam Vitals reviewed.  Constitutional:      Appearance: Normal appearance.  HENT:     Head: Normocephalic and atraumatic.  Eyes:     Conjunctiva/sclera: Conjunctivae normal.     Pupils: Pupils are equal, round, and reactive to light.  Cardiovascular:     Rate and Rhythm: Normal rate and regular rhythm.     Pulses: Normal pulses.     Heart sounds: Normal heart sounds.  Pulmonary:     Effort: Pulmonary effort is normal.     Breath sounds:  Normal breath sounds.  Abdominal:     Palpations: Abdomen is soft.     Tenderness: There is no abdominal tenderness.  Musculoskeletal:        General: Normal range of motion.     Cervical back: Normal range of motion.     Comments: Rt hip tender   Skin:    General: Skin is warm and dry.  Neurological:     General: No focal deficit present.     Mental Status: She is alert and oriented to person, place, and time.  Psychiatric:        Mood and Affect: Mood normal.        Behavior: Behavior normal.     BP 112/80 (BP Location: Left Arm, Patient Position: Sitting, Cuff Size: Normal)   Pulse 79   Temp 97.6 F (36.4 C) (Oral)   Ht 5\' 2"  (1.575 m)   Wt 195 lb (88.5 kg)   SpO2 99%   BMI 35.67 kg/m  Wt Readings from Last 3 Encounters:  08/03/19 195 lb (88.5 kg)  09/08/18 178 lb 6.4 oz (80.9 kg)  08/25/18 177 lb (80.3 kg)     Health Maintenance Due  Topic Date Due  . Hepatitis C Screening  Never done  . PAP SMEAR-Modifier  08/25/2019    There are no preventive care reminders to display for this patient.  Lab Results  Component Value Date   TSH 5.10 (H) 08/25/2018   Lab Results  Component Value Date   WBC 8.6 08/25/2018   HGB 13.8 08/25/2018   HCT 40.4 08/25/2018   MCV 91.0 08/25/2018   PLT 300 08/25/2018   Lab Results  Component Value Date   NA 137 08/25/2018   K 4.9 08/25/2018   CO2 23 08/25/2018   GLUCOSE 87 08/25/2018   BUN 10 08/25/2018   CREATININE 0.58 08/25/2018   BILITOT 1.8 (H)  09/08/2018   ALKPHOS 66 09/08/2018   AST 16 09/08/2018   ALT 11 09/08/2018   PROT 6.5 09/08/2018   ALBUMIN 4.0 09/08/2018   CALCIUM 9.4 08/25/2018   Lab Results  Component Value Date   CHOL 177 08/25/2018   Lab Results  Component Value Date   HDL 49 (L) 08/25/2018   Lab Results  Component Value Date   LDLCALC 111 (H) 08/25/2018   Lab Results  Component Value Date   TRIG 84 08/25/2018   Lab Results  Component Value Date   CHOLHDL 3.6 08/25/2018   No results found for: HGBA1C CLINICAL DATA:  Elevated bilirubin.  History of Gilbert's syndrome.  EXAM: ULTRASOUND ABDOMEN LIMITED RIGHT UPPER QUADRANT  COMPARISON:  Ultrasound, 11/01/2013.  FINDINGS: Gallbladder:  No gallstones or wall thickening visualized. No sonographic Murphy sign noted by sonographer.  Common bile duct:  Diameter: 3 mm  Liver:  No focal lesion identified. Within normal limits in parenchymal echogenicity. Portal vein is patent on color Doppler imaging with normal direction of blood flow towards the liver.  Other: None.  IMPRESSION: Normal right upper quadrant ultrasound.   Electronically Signed   By: Lajean Manes M.D.   On: 09/29/2018 14:33    Assessment & Plan:   Problem List Items Addressed This Visit      Endocrine   Hypothyroidism     Other   Gilbert's syndrome   Relevant Orders   Comprehensive metabolic panel   Low vitamin D level - Primary   Relevant Orders   VITAMIN D 25 Hydroxy (Vit-D Deficiency, Fractures)   B12 and  Folate Panel   Elevated bilirubin   Lumbar pain with radiation down left leg   Relevant Medications   meloxicam (MOBIC) 7.5 MG tablet   Other Relevant Orders   DG Lumbar Spine Complete   BMI 35.0-35.9,adult   Relevant Orders   CBC with Differential/Platelet   TSH   Hemoglobin A1c   Lipid panel    Other Visit Diagnoses    Need for hepatitis C screening test       Relevant Orders   Hepatitis C antibody   Screening for HIV  (human immunodeficiency virus)       Relevant Orders   HIV Antibody (routine testing w rflx)      Meds ordered this encounter  Medications  . meloxicam (MOBIC) 7.5 MG tablet    Sig: Take 1 tablet (7.5 mg total) by mouth daily.    Dispense:  14 tablet    Refill:  0    Order Specific Question:   Supervising Provider    Answer:   Einar Pheasant [[id number]]   Have ordered x-rays of the lumbar spine for your back pain leg.  Pending results, will consider referral to physical therapy.    Trial of Mobic 7.5 mg 1 tablet daily for 2 weeks. Hold on the Advil/Aleve while taking this.   Continue with healthy lifestyle as you are doing a weight loss goals of 10% over the next 6 months.  See low glycemic index sheet.  We recommend weight watchers as well.  A diet rich in vegetables, lean protein, nonfat dairy, fruits, and low glycemic index complex carbs is ideal.  Arrange follow-up.  You are due for a mammogram in October.  Follow-up: Return in about 3 months (around 11/03/2019).    Denice Paradise, NP

## 2019-08-06 LAB — HEPATITIS C ANTIBODY
Hepatitis C Ab: NONREACTIVE
SIGNAL TO CUT-OFF: 0.01 (ref <1.00)

## 2019-08-06 LAB — HIV ANTIBODY (ROUTINE TESTING W REFLEX): HIV 1&2 Ab, 4th Generation: NONREACTIVE

## 2019-09-09 ENCOUNTER — Other Ambulatory Visit: Payer: Self-pay | Admitting: Family Medicine

## 2019-09-09 DIAGNOSIS — E039 Hypothyroidism, unspecified: Secondary | ICD-10-CM

## 2019-09-17 ENCOUNTER — Other Ambulatory Visit: Payer: Self-pay | Admitting: Obstetrics & Gynecology

## 2019-09-17 DIAGNOSIS — Z1231 Encounter for screening mammogram for malignant neoplasm of breast: Secondary | ICD-10-CM

## 2019-09-21 ENCOUNTER — Encounter: Payer: Self-pay | Admitting: Obstetrics & Gynecology

## 2019-09-21 ENCOUNTER — Ambulatory Visit (INDEPENDENT_AMBULATORY_CARE_PROVIDER_SITE_OTHER): Payer: 59 | Admitting: Obstetrics & Gynecology

## 2019-09-21 ENCOUNTER — Other Ambulatory Visit: Payer: Self-pay

## 2019-09-21 VITALS — BP 126/78 | Ht 62.0 in | Wt 179.0 lb

## 2019-09-21 DIAGNOSIS — E6609 Other obesity due to excess calories: Secondary | ICD-10-CM

## 2019-09-21 DIAGNOSIS — Z30431 Encounter for routine checking of intrauterine contraceptive device: Secondary | ICD-10-CM

## 2019-09-21 DIAGNOSIS — Z6832 Body mass index (BMI) 32.0-32.9, adult: Secondary | ICD-10-CM | POA: Diagnosis not present

## 2019-09-21 DIAGNOSIS — Z01419 Encounter for gynecological examination (general) (routine) without abnormal findings: Secondary | ICD-10-CM

## 2019-09-21 NOTE — Progress Notes (Signed)
Julia Wagner 1970/06/03 [id number]   History:    49 y.o. G2P2L2 Married.  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  RP:  Established patient presenting for annual gyn exam   HPI: Well on Mirena IUD x 01/2018.  No menses, no BTB.  No pelvic pain.  No pain with intercourse.  Urine and bowel movements normal.  Breasts normal.  Body mass index 32.34 stable x last year, but actually gained and lost 20 Lbs.  Will continue with physical activities and lower calories.  Health labs with FNP 07/2019.  Past medical history,surgical history, family history and social history were all reviewed and documented in the EPIC chart.  Gynecologic History No LMP recorded. (Menstrual status: IUD).  Obstetric History OB History  Gravida Para Term Preterm AB Living  2 2       2   SAB TAB Ectopic Multiple Live Births               # Outcome Date GA Lbr Len/2nd Weight Sex Delivery Anes PTL Lv  2 Para           1 Para              ROS: A ROS was performed and pertinent positives and negatives are included in the history.  GENERAL: No fevers or chills. HEENT: No change in vision, no earache, sore throat or sinus congestion. NECK: No pain or stiffness. CARDIOVASCULAR: No chest pain or pressure. No palpitations. PULMONARY: No shortness of breath, cough or wheeze. GASTROINTESTINAL: No abdominal pain, nausea, vomiting or diarrhea, melena or bright red blood per rectum. GENITOURINARY: No urinary frequency, urgency, hesitancy or dysuria. MUSCULOSKELETAL: No joint or muscle pain, no back pain, no recent trauma. DERMATOLOGIC: No rash, no itching, no lesions. ENDOCRINE: No polyuria, polydipsia, no heat or cold intolerance. No recent change in weight. HEMATOLOGICAL: No anemia or easy bruising or bleeding. NEUROLOGIC: No headache, seizures, numbness, tingling or weakness. PSYCHIATRIC: No depression, no loss of interest in normal activity or change in sleep pattern.     Exam:   BP 126/78   Ht  5\' 2"  (1.575 m)   Wt 179 lb (81.2 kg)   BMI 32.74 kg/m   Body mass index is 32.74 kg/m.  General appearance : Well developed well nourished female. No acute distress HEENT: Eyes: no retinal hemorrhage or exudates,  Neck supple, trachea midline, no carotid bruits, no thyroidmegaly Lungs: Clear to auscultation, no rhonchi or wheezes, or rib retractions  Heart: Regular rate and rhythm, no murmurs or gallops Breast:Examined in sitting and supine position were symmetrical in appearance, no palpable masses or tenderness,  no skin retraction, no nipple inversion, no nipple discharge, no skin discoloration, no axillary or supraclavicular lymphadenopathy Abdomen: no palpable masses or tenderness, no rebound or guarding Extremities: no edema or skin discoloration or tenderness  Pelvic: Vulva: Normal             Vagina: No gross lesions or discharge  Cervix: No gross lesions or discharge.  IUD strings felt at the [facility].  Uterus  AV, normal size, shape and consistency, non-tender and mobile  Adnexa  Without masses or tenderness  Anus: Normal   Assessment/Plan:  49 y.o. female for annual exam   1. Well female exam with routine gynecological exam Normal gynecologic exam.  Pap test August 2020 was negative, no indication to repeat a Pap test this year.  Breast exam normal.  Screening mammogram October 2020 was negative.  Will organize a referral to gastro for colonoscopy.  Health labs with family nurse practitioner July 2021.  2. Encounter for routine checking of intrauterine contraceptive device (IUD) Well on Mirena IUD since January 2020.  IUD in good position.  3. Class 1 obesity due to excess calories without serious comorbidity with body mass index (BMI) of 32.0 to 32.9 in adult We will continue on a low calorie/carb diet.  Aerobic activities 5 times a week and light weightlifting every 2 days.  Other orders - cholecalciferol (VITAMIN D3) 25 MCG (1000 UNIT) tablet; Take 1,000 Units by mouth  daily.  Princess Bruins MD, 2:13 PM 09/21/2019

## 2019-09-24 ENCOUNTER — Telehealth: Payer: Self-pay | Admitting: *Deleted

## 2019-09-24 NOTE — Telephone Encounter (Signed)
Left detailed message on cell with [facility] [phone] to call and schedule the below. No referral needed.

## 2019-09-24 NOTE — Telephone Encounter (Signed)
-----   Message from Princess Bruins, MD sent at 09/21/2019  2:38 PM EDT ----- Regarding: Refer to [facility]

## 2019-10-01 ENCOUNTER — Encounter: Payer: Self-pay | Admitting: Gastroenterology

## 2019-10-25 ENCOUNTER — Other Ambulatory Visit: Payer: Self-pay

## 2019-10-25 ENCOUNTER — Ambulatory Visit (AMBULATORY_SURGERY_CENTER): Payer: Self-pay

## 2019-10-25 VITALS — Ht 62.0 in | Wt 175.8 lb

## 2019-10-25 DIAGNOSIS — Z1211 Encounter for screening for malignant neoplasm of colon: Secondary | ICD-10-CM

## 2019-10-25 MED ORDER — SUTAB 1479-225-188 MG PO TABS: 12.0000 | ORAL_TABLET | ORAL | 0 refills | Status: DC

## 2019-10-25 NOTE — Progress Notes (Signed)
No allergies to soy or egg Pt is not on blood thinners or diet pills Denies issues with sedation/intubation Denies atrial flutter/fib Denies constipation   Emmi instructions given to pt  Pt is aware of Covid safety and care partner requirements.  

## 2019-10-26 ENCOUNTER — Ambulatory Visit
Admission: RE | Admit: 2019-10-26 | Discharge: 2019-10-26 | Disposition: A | Discharge status: Home / Self Care | Payer: 59 | Source: Ambulatory Visit | Attending: Obstetrics & Gynecology | Admitting: Obstetrics & Gynecology

## 2019-10-26 ENCOUNTER — Encounter: Payer: Self-pay | Admitting: Gastroenterology

## 2019-10-26 ENCOUNTER — Other Ambulatory Visit: Payer: Self-pay

## 2019-10-26 DIAGNOSIS — Z1231 Encounter for screening mammogram for malignant neoplasm of breast: Secondary | ICD-10-CM

## 2019-10-30 ENCOUNTER — Other Ambulatory Visit: Payer: Self-pay | Admitting: Obstetrics & Gynecology

## 2019-10-30 DIAGNOSIS — R928 Other abnormal and inconclusive findings on diagnostic imaging of breast: Secondary | ICD-10-CM

## 2019-10-31 ENCOUNTER — Ambulatory Visit
Admission: RE | Admit: 2019-10-31 | Discharge: 2019-10-31 | Disposition: A | Discharge status: Home / Self Care | Payer: 59 | Source: Ambulatory Visit | Attending: Obstetrics & Gynecology | Admitting: Obstetrics & Gynecology

## 2019-10-31 ENCOUNTER — Other Ambulatory Visit: Payer: Self-pay

## 2019-10-31 DIAGNOSIS — R928 Other abnormal and inconclusive findings on diagnostic imaging of breast: Secondary | ICD-10-CM

## 2019-11-09 ENCOUNTER — Ambulatory Visit: Payer: 59 | Admitting: Nurse Practitioner

## 2019-11-16 ENCOUNTER — Other Ambulatory Visit: Payer: Self-pay

## 2019-11-16 ENCOUNTER — Encounter: Payer: Self-pay | Admitting: Gastroenterology

## 2019-11-16 ENCOUNTER — Ambulatory Visit (AMBULATORY_SURGERY_CENTER): Payer: 59 | Admitting: Gastroenterology

## 2019-11-16 VITALS — BP 108/61 | HR 64 | Temp 98.2°F | Resp 12 | Ht 62.0 in | Wt 175.8 lb

## 2019-11-16 DIAGNOSIS — Z1211 Encounter for screening for malignant neoplasm of colon: Secondary | ICD-10-CM

## 2019-11-16 DIAGNOSIS — D123 Benign neoplasm of transverse colon: Secondary | ICD-10-CM | POA: Diagnosis not present

## 2019-11-16 MED ORDER — SODIUM CHLORIDE 0.9 % IV SOLN: 500.0000 mL | Freq: Once | INTRAVENOUS | Status: DC

## 2019-11-16 NOTE — Progress Notes (Signed)
Pt's states no medical or surgical changes since previsit or office visit.   VS taken by CW 

## 2019-11-16 NOTE — Patient Instructions (Signed)
Please read handouts provided. Continue present medications. Await pathology results.   YOU HAD AN ENDOSCOPIC PROCEDURE TODAY AT THE [facility]:   Refer to the procedure report that was given to you for any specific questions about what was found during the examination.  If the procedure report does not answer your questions, please call your gastroenterologist to clarify.  If you requested that your care partner not be given the details of your procedure findings, then the procedure report has been included in a sealed envelope for you to review at your convenience later.  YOU SHOULD EXPECT: Some feelings of bloating in the abdomen. Passage of more gas than usual.  Walking can help get rid of the air that was put into your GI tract during the procedure and reduce the bloating. If you had a lower endoscopy (such as a colonoscopy or flexible sigmoidoscopy) you may notice spotting of blood in your stool or on the toilet paper. If you underwent a bowel prep for your procedure, you may not have a normal bowel movement for a few days.  Please Note:  You might notice some irritation and congestion in your nose or some drainage.  This is from the oxygen used during your procedure.  There is no need for concern and it should clear up in a day or so.  SYMPTOMS TO REPORT IMMEDIATELY:  Following lower endoscopy (colonoscopy or flexible sigmoidoscopy):  Excessive amounts of blood in the stool  Significant tenderness or worsening of abdominal pains  Swelling of the abdomen that is new, acute  Fever of 100F or higher   For urgent or emergent issues, a gastroenterologist can be reached at any hour by calling [phone]. Do not use MyChart messaging for urgent concerns.    DIET:  We do recommend a small meal at first, but then you may proceed to your regular diet.  Drink plenty of fluids but you should avoid alcoholic beverages for 24 hours.  ACTIVITY:  You should plan to take it easy  for the rest of today and you should NOT DRIVE or use heavy machinery until tomorrow (because of the sedation medicines used during the test).    FOLLOW UP: Our staff will call the number listed on your records 48-72 hours following your procedure to check on you and address any questions or concerns that you may have regarding the information given to you following your procedure. If we do not reach you, we will leave a message.  We will attempt to reach you two times.  During this call, we will ask if you have developed any symptoms of COVID 19. If you develop any symptoms (ie: fever, flu-like symptoms, shortness of breath, cough etc.) before then, please call [phone].  If you test positive for Covid 19 in the 2 weeks post procedure, please call and report this information to us.    If any biopsies were taken you will be contacted by phone or by letter within the next 1-3 weeks.  Please call us at [phone] if you have not heard about the biopsies in 3 weeks.    SIGNATURES/CONFIDENTIALITY: You and/or your care partner have signed paperwork which will be entered into your electronic medical record.  These signatures attest to the fact that that the information above on your After Visit Summary has been reviewed and is understood.  Full responsibility of the confidentiality of this discharge information lies with you and/or your care-partner.  

## 2019-11-16 NOTE — Progress Notes (Signed)
To PACU, vss. Report to [email or web address] 

## 2019-11-16 NOTE — Op Note (Signed)
[facility] Patient Name: Julia Wagner Procedure Date: 11/16/2019 7:53 AM MRN: [id number] Endoscopist: Remo Lipps P. Havery Moros , MD Age: 49 Referring MD:  Date of Birth: [birth year] Gender: Female Account #: [id number] Procedure:                Colonoscopy Indications:              Screening for colorectal malignant neoplasm, This                            is the patient's first colonoscopy Medicines:                Monitored Anesthesia Care Procedure:                Pre-Anesthesia Assessment:                           - Prior to the procedure, a History and Physical                            was performed, and patient medications and                            allergies were reviewed. The patient's tolerance of                            previous anesthesia was also reviewed. The risks                            and benefits of the procedure and the sedation                            options and risks were discussed with the patient.                            All questions were answered, and informed consent                            was obtained. Prior Anticoagulants: The patient has                            taken no previous anticoagulant or antiplatelet                            agents. ASA Grade Assessment: II - A patient with                            mild systemic disease. After reviewing the risks                            and benefits, the patient was deemed in                            satisfactory condition to undergo the procedure.  After obtaining informed consent, the colonoscope                            was passed under direct vision. Throughout the                            procedure, the patient's blood pressure, pulse, and                            oxygen saturations were monitored continuously. The                            Colonoscope was introduced through the anus and                            advanced to the the  cecum, identified by                            appendiceal orifice and ileocecal valve. The                            colonoscopy was performed without difficulty. The                            patient tolerated the procedure well. The quality                            of the bowel preparation was good. The ileocecal                            valve, appendiceal orifice, and rectum were                            photographed. Scope In: 8:01:58 AM Scope Out: 8:20:18 AM Scope Withdrawal Time: 0 hours 14 minutes 4 seconds  Total Procedure Duration: 0 hours 18 minutes 20 seconds  Findings:                 The perianal and digital rectal examinations were                            normal.                           Two sessile polyps were found in the splenic                            flexure. The polyps were 3 to 4 mm in size. These                            polyps were removed with a cold snare. Resection                            and retrieval were complete.  The exam was otherwise without abnormality. Complications:            No immediate complications. Estimated blood loss:                            Minimal. Estimated Blood Loss:     Estimated blood loss was minimal. Impression:               - Two 3 to 4 mm polyps at the splenic flexure,                            removed with a cold snare. Resected and retrieved.                           - The examination was otherwise normal. Recommendation:           - Patient has a contact number available for                            emergencies. The signs and symptoms of potential                            delayed complications were discussed with the                            patient. Return to normal activities tomorrow.                            Written discharge instructions were provided to the                            patient.                           - Resume previous diet.                            - Continue present medications.                           - Await pathology results. Remo Lipps P. Jamica Woodyard, MD 11/16/2019 8:23:54 AM This report has been signed electronically.

## 2019-11-16 NOTE — Progress Notes (Signed)
Called to room to assist during endoscopic procedure.  Patient ID and intended procedure confirmed with present staff. Received instructions for my participation in the procedure from the performing physician.  

## 2019-11-20 ENCOUNTER — Telehealth: Payer: Self-pay | Admitting: *Deleted

## 2019-11-20 ENCOUNTER — Telehealth: Payer: Self-pay

## 2019-11-20 NOTE — Telephone Encounter (Signed)
No answer, left message to call back later today, B.Granville Whitefield RN. 

## 2019-11-20 NOTE — Telephone Encounter (Signed)
°  Follow up Call-  Call back number 11/16/2019  Post procedure Call Back phone  # [phone]  Permission to leave phone message Yes  Some recent data might be hidden     Patient questions:  Do you have a fever, pain , or abdominal swelling? No. Pain Score  0 *  Have you tolerated food without any problems? Yes.    Have you been able to return to your normal activities? Yes.    Do you have any questions about your discharge instructions: Diet   No. Medications  No. Follow up visit  No.  Do you have questions or concerns about your Care? No.  Actions: * If pain score is 4 or above: No action needed, pain <4.  1. Have you developed a fever since your procedure? no  2.   Have you had an respiratory symptoms (SOB or cough) since your procedure? no  3.   Have you tested positive for COVID 19 since your procedure no  4.   Have you had any family members/close contacts diagnosed with the COVID 19 since your procedure?  no   If yes to any of these questions please route to Joylene John, RN and Joella Prince, RN

## 2019-11-22 ENCOUNTER — Encounter: Payer: Self-pay | Admitting: Gastroenterology

## 2020-03-18 ENCOUNTER — Other Ambulatory Visit: Payer: Self-pay | Admitting: Family Medicine

## 2020-03-18 DIAGNOSIS — E039 Hypothyroidism, unspecified: Secondary | ICD-10-CM

## 2020-03-18 NOTE — Telephone Encounter (Signed)
Patient establishing with NP 05/12/20 ok to fill levothyroxine until appt. Lat TSH 7/21

## 2020-03-18 NOTE — Telephone Encounter (Signed)
FYI.  I sent in prescription for levothyroxine #90 with no refills.  This will get pt through until appt.

## 2020-05-12 ENCOUNTER — Encounter: Payer: Self-pay | Admitting: Nurse Practitioner

## 2020-05-12 ENCOUNTER — Ambulatory Visit: Payer: 59 | Admitting: Nurse Practitioner

## 2020-05-12 ENCOUNTER — Other Ambulatory Visit: Payer: Self-pay

## 2020-05-12 ENCOUNTER — Ambulatory Visit: Payer: 59 | Admitting: Adult Health

## 2020-05-12 VITALS — BP 120/74 | Temp 98.6°F | Ht 63.0 in | Wt 160.0 lb

## 2020-05-12 DIAGNOSIS — R350 Frequency of micturition: Secondary | ICD-10-CM | POA: Diagnosis not present

## 2020-05-12 DIAGNOSIS — N3090 Cystitis, unspecified without hematuria: Secondary | ICD-10-CM | POA: Diagnosis not present

## 2020-05-12 MED ORDER — SULFAMETHOXAZOLE-TRIMETHOPRIM 800-160 MG PO TABS: ORAL_TABLET | ORAL | 0 refills | Status: DC

## 2020-05-12 NOTE — Progress Notes (Signed)
GYNECOLOGY  VISIT  WE:[id number] urination,urgency,hematuria    HPI: 50 y.o. G2P2 Married White or Caucasian female here for UTI symptoms.   2 weeks ago went to urgent care for same symptoms, given antibiotics (macrobid) for UTI, but did not seem to help symptoms. Still has all sme symptoms as in CC, but now can see some blood. Called Friday for appointment, but was too late to be seen before the weekend.  Reports mild back pain Taking Azo, helps some what. Has IUD replaced 01/2018, does not get periods, but unsure if blood coming from IUD Does not feel feverish   GYNECOLOGIC HISTORY: No LMP recorded. (Menstrual status: IUD). Contraception: Mirena IUD   Patient Active Problem List   Diagnosis Date Noted  . 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 BMI 35.0-35.9,adult 08/03/2019  . Low vitamin D level 09/08/2018  . Elevated bilirubin 09/08/2018  . Ovarian cyst, left 01/11/2014  . 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 Ovarian cyst, right 11/02/2013  . Encounter for insertion of mirena IUD 02/06/2013  . Nonspecific abnormal results of liver function study 09/11/2012  . Eczema   . Hypothyroidism 10/02/2010  . IUD 10/02/2010    Past Medical History:  Diagnosis Date  . Allergy    seasonal  . Anxiety   . Chicken pox   . Eczema   . Fibroid, uterine   . Hypothyroidism   . IUD 09/04/2010   Mirena IUD  . Kidney stones   . UTI (urinary tract infection)     Past Surgical History:  Procedure Laterality Date  . DILATATION & CURETTAGE/HYSTEROSCOPY WITH TRUECLEAR N/A 09/13/2012   Procedure: DILATATION & CURETTAGE/HYSTEROSCOPY TRUCLEAR RESECTOSCOPIC MYOMECTOMY, ;  Surgeon: Terrance Mass, MD;  Location: [facility];  Service: Gynecology;  Laterality: N/A;  TRUCLEAR RESECTOSCOPIC MYOMECTOMY, REMOVAL OF IUD   . IUD REMOVAL N/A 09/13/2012   Procedure: INTRAUTERINE DEVICE (IUD) REMOVAL;  Surgeon: Terrance Mass, MD;  Location: [facility];  Service: Gynecology;  Laterality: N/A;  .  Mirena     Inserted 02-06-13  . MYOMECTOMY    . Removal of fibroma      MEDS:   Current Outpatient Medications on File Prior to Visit  Medication Sig Dispense Refill  . cholecalciferol (VITAMIN D3) 25 MCG (1000 UNIT) tablet Take 1,000 Units by mouth daily.    Marland Kitchen levothyroxine (SYNTHROID) 75 MCG tablet TAKE 1 TABLET BY MOUTH EVERY DAY 90 tablet 0  . loratadine (CLARITIN) 10 MG tablet Take 10 mg by mouth daily.    . vitamin B-12 (CYANOCOBALAMIN) 1000 MCG tablet Take 1,000 mcg by mouth daily.     Current Facility-Administered Medications on File Prior to Visit  Medication Dose Route Frequency Provider Last Rate Last Admin  . levonorgestrel (MIRENA) 20 MCG/24HR IUD   Intrauterine Once Terrance Mass, MD        ALLERGIES: Patient has no known allergies.  Family History  Problem Relation Age of Onset  . Hypertension Mother   . Colon polyps Mother   . Arthritis Mother   . Hyperlipidemia Mother   . Diabetes Father   . Hypertension Father   . Heart disease Father   . Kidney disease Father   . Arthritis Father   . Hyperlipidemia Father   . Stroke Father   . Heart disease Paternal Grandfather   . Heart disease Paternal Grandmother   . Diabetes Maternal Grandmother   . Colon cancer Neg Hx        ?  Marland Kitchen  Esophageal cancer Neg Hx   . Rectal cancer Neg Hx   . Stomach cancer Neg Hx      Review of Systems  PHYSICAL EXAMINATION:    There were no vitals taken for this visit.    General appearance: alert, cooperative, no acute distress Neg CVAT Lymph:  no inguinal LAD noted  Pelvic: External genitalia:  no lesions              Urethra:  normal appearing urethra with no masses, tenderness or lesions              Bartholins and Skenes: normal                 Vagina: normal appearing vagina , no visible blood               Cervix: no cervical motion tenderness and no lesions, iud strings seen                UA: Nitrate +, Leukocytes 3+ Microscopy: 40-60 WBC Many bacteria 3-10  RBC  Assessment/Plan: Cystitis- Bactrim DS, 1 po BID x 5 days  IUD check- appears to be in good placement  Frequent urination - Plan: Urinalysis,Complete w/RFL Culture  Culture to be sent to check for sensitivity

## 2020-05-12 NOTE — Patient Instructions (Signed)
Urinary Tract Infection, Adult  A urinary tract infection (UTI) is an infection of any part of the urinary tract. The urinary tract includes the kidneys, ureters, bladder, and urethra. These organs make, store, and get rid of urine in the body. An upper UTI affects the ureters and kidneys. A lower UTI affects the bladder and urethra. What are the causes? Most urinary tract infections are caused by bacteria in your genital area around your urethra, where urine leaves your body. These bacteria grow and cause inflammation of your urinary tract. What increases the risk? You are more likely to develop this condition if:  You have a urinary catheter that stays in place.  You are not able to control when you urinate or have a bowel movement (incontinence).  You are female and you: ? Use a spermicide or diaphragm for birth control. ? Have low estrogen levels. ? Are pregnant.  You have certain genes that increase your risk.  You are sexually active.  You take antibiotic medicines.  You have a condition that causes your flow of urine to slow down, such as: ? An enlarged prostate, if you are female. ? Blockage in your urethra. ? A kidney stone. ? A nerve condition that affects your bladder control (neurogenic bladder). ? Not getting enough to drink, or not urinating often.  You have certain medical conditions, such as: ? Diabetes. ? A weak disease-fighting system (immunesystem). ? Sickle cell disease. ? Gout. ? Spinal cord injury. What are the signs or symptoms? Symptoms of this condition include:  Needing to urinate right away (urgency).  Frequent urination. This may include small amounts of urine each time you urinate.  Pain or burning with urination.  Blood in the urine.  Urine that smells bad or unusual.  Trouble urinating.  Cloudy urine.  Vaginal discharge, if you are female.  Pain in the abdomen or the lower back. You may also have:  Vomiting or a decreased  appetite.  Confusion.  Irritability or tiredness.  A fever or chills.  Diarrhea. The first symptom in older adults may be confusion. In some cases, they may not have any symptoms until the infection has worsened. How is this diagnosed? This condition is diagnosed based on your medical history and a physical exam. You may also have other tests, including:  Urine tests.  Blood tests.  Tests for STIs (sexually transmitted infections). If you have had more than one UTI, a cystoscopy or imaging studies may be done to determine the cause of the infections. How is this treated? Treatment for this condition includes:  Antibiotic medicine.  Over-the-counter medicines to treat discomfort.  Drinking enough water to stay hydrated. If you have frequent infections or have other conditions such as a kidney stone, you may need to see a health care provider who specializes in the urinary tract (urologist). In rare cases, urinary tract infections can cause sepsis. Sepsis is a life-threatening condition that occurs when the body responds to an infection. Sepsis is treated in the hospital with IV antibiotics, fluids, and other medicines. Follow these instructions at home: Medicines  Take over-the-counter and prescription medicines only as told by your health care provider.  If you were prescribed an antibiotic medicine, take it as told by your health care provider. Do not stop using the antibiotic even if you start to feel better. General instructions  Make sure you: ? Empty your bladder often and completely. Do not hold urine for long periods of time. ? Empty your bladder after   sex. ? Wipe from front to back after urinating or having a bowel movement if you are female. Use each tissue only one time when you wipe.  Drink enough fluid to keep your urine pale yellow.  Keep all follow-up visits. This is important.   Contact a health care provider if:  Your symptoms do not get better after 1-2  days.  Your symptoms go away and then return. Get help right away if:  You have severe pain in your back or your lower abdomen.  You have a fever or chills.  You have nausea or vomiting. Summary  A urinary tract infection (UTI) is an infection of any part of the urinary tract, which includes the kidneys, ureters, bladder, and urethra.  Most urinary tract infections are caused by bacteria in your genital area.  Treatment for this condition often includes antibiotic medicines.  If you were prescribed an antibiotic medicine, take it as told by your health care provider. Do not stop using the antibiotic even if you start to feel better.  Keep all follow-up visits. This is important. This information is not intended to replace advice given to you by your health care provider. Make sure you discuss any questions you have with your health care provider. Document Revised: 08/03/2019 Document Reviewed: 08/03/2019 Elsevier Patient Education  2021 Elsevier Inc.  

## 2020-05-15 LAB — URINALYSIS, COMPLETE W/RFL CULTURE: Hyaline Cast: NONE SEEN /LPF

## 2020-05-15 LAB — URINE CULTURE
MICRO NUMBER:: 11865511
SPECIMEN QUALITY:: ADEQUATE

## 2020-05-15 LAB — CULTURE INDICATED

## 2020-05-15 NOTE — Progress Notes (Signed)
Please notify that urine confirmed bladder infection and that Bactrim should be effective. Encourage to finish all antibiotics.

## 2020-06-13 ENCOUNTER — Telehealth: Payer: Self-pay | Admitting: Internal Medicine

## 2020-06-13 ENCOUNTER — Other Ambulatory Visit: Payer: Self-pay | Admitting: Internal Medicine

## 2020-06-13 DIAGNOSIS — E039 Hypothyroidism, unspecified: Secondary | ICD-10-CM

## 2020-06-13 MED ORDER — LEVOTHYROXINE SODIUM 75 MCG PO TABS: 75.0000 ug | ORAL_TABLET | Freq: Every day | ORAL | 0 refills | Status: DC

## 2020-06-13 NOTE — Telephone Encounter (Signed)
Pt cancelled new pt appt 05/12/20. Dr Nicki Reaper did one time refill in March. Unable to reach pt to schedule appt.

## 2020-06-13 NOTE — Telephone Encounter (Signed)
Pt  needs appt no tsh lab 07/2019  Sch new patient appt here again Julia Wagner mills was PCP and no recent labs for thyroid medication refill will refill until 08/03/19  No visit since 08/03/19 will need to establish for appt further refills thyroid medication  Needs to schedule appt    Dr. Olivia Mackie MCLean-Scocuzza

## 2020-06-17 NOTE — Telephone Encounter (Signed)
I am not taking her as a new patient was covering  Has been a while will need to establish with Sharyn Lull

## 2020-08-05 ENCOUNTER — Other Ambulatory Visit: Payer: Self-pay | Admitting: Internal Medicine

## 2020-08-05 DIAGNOSIS — E039 Hypothyroidism, unspecified: Secondary | ICD-10-CM

## 2020-08-16 ENCOUNTER — Other Ambulatory Visit: Payer: Self-pay | Admitting: Internal Medicine

## 2020-08-16 DIAGNOSIS — E039 Hypothyroidism, unspecified: Secondary | ICD-10-CM

## 2020-08-21 ENCOUNTER — Other Ambulatory Visit: Payer: Self-pay | Admitting: Internal Medicine

## 2020-08-21 DIAGNOSIS — E039 Hypothyroidism, unspecified: Secondary | ICD-10-CM

## 2020-09-26 ENCOUNTER — Ambulatory Visit (INDEPENDENT_AMBULATORY_CARE_PROVIDER_SITE_OTHER): Payer: 59 | Admitting: Obstetrics & Gynecology

## 2020-09-26 ENCOUNTER — Other Ambulatory Visit: Payer: Self-pay

## 2020-09-26 ENCOUNTER — Encounter: Payer: Self-pay | Admitting: Obstetrics & Gynecology

## 2020-09-26 VITALS — BP 118/80 | HR 65 | Resp 16 | Ht 61.75 in | Wt 157.0 lb

## 2020-09-26 DIAGNOSIS — Z30431 Encounter for routine checking of intrauterine contraceptive device: Secondary | ICD-10-CM | POA: Diagnosis not present

## 2020-09-26 DIAGNOSIS — Z01419 Encounter for gynecological examination (general) (routine) without abnormal findings: Secondary | ICD-10-CM

## 2020-09-26 NOTE — Progress Notes (Signed)
Julia Wagner Feb 02, 1970 [id number]   History:    50 y.o. G2P2L2 Married.  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  Daughter Paramedic at [employer].   RP:  Established patient presenting for annual gyn exam    HPI: Well on Mirena IUD x 01/2018.  No menses, no BTB.  No pelvic pain.  No pain with intercourse.  Urine and bowel movements normal.  Breasts normal.  Body mass index 28.95, improved.  Will continue with physical activities and lower calories.  Health labs with FNP.    Past medical history,surgical history, family history and social history were all reviewed and documented in the EPIC chart.  Gynecologic History No LMP recorded. (Menstrual status: IUD).  Obstetric History OB History  Gravida Para Term Preterm AB Living  2 2       2   SAB IAB Ectopic Multiple Live Births               # Outcome Date GA Lbr Len/2nd Weight Sex Delivery Anes PTL Lv  2 Para           1 Para              ROS: A ROS was performed and pertinent positives and negatives are included in the history.  GENERAL: No fevers or chills. HEENT: No change in vision, no earache, sore throat or sinus congestion. NECK: No pain or stiffness. CARDIOVASCULAR: No chest pain or pressure. No palpitations. PULMONARY: No shortness of breath, cough or wheeze. GASTROINTESTINAL: No abdominal pain, nausea, vomiting or diarrhea, melena or bright red blood per rectum. GENITOURINARY: No urinary frequency, urgency, hesitancy or dysuria. MUSCULOSKELETAL: No joint or muscle pain, no back pain, no recent trauma. DERMATOLOGIC: No rash, no itching, no lesions. ENDOCRINE: No polyuria, polydipsia, no heat or cold intolerance. No recent change in weight. HEMATOLOGICAL: No anemia or easy bruising or bleeding. NEUROLOGIC: No headache, seizures, numbness, tingling or weakness. PSYCHIATRIC: No depression, no loss of interest in normal activity or change in sleep pattern.     Exam:   BP 118/80   Pulse 65   Resp 16   Ht 5' 1.75" (1.568 m)   Wt 157 lb  (71.2 kg)   BMI 28.95 kg/m   Body mass index is 28.95 kg/m.  General appearance : Well developed well nourished female. No acute distress HEENT: Eyes: no retinal hemorrhage or exudates,  Neck supple, trachea midline, no carotid bruits, no thyroidmegaly Lungs: Clear to auscultation, no rhonchi or wheezes, or rib retractions  Heart: Regular rate and rhythm, no murmurs or gallops Breast:Examined in sitting and supine position were symmetrical in appearance, no palpable masses or tenderness,  no skin retraction, no nipple inversion, no nipple discharge, no skin discoloration, no axillary or supraclavicular lymphadenopathy Abdomen: no palpable masses or tenderness, no rebound or guarding Extremities: no edema or skin discoloration or tenderness  Pelvic: Vulva: Normal             Vagina: No gross lesions or discharge  Cervix: No gross lesions or discharge.  IUD strings felt at [facility].  Uterus  AV, normal size, shape and consistency, non-tender and mobile  Adnexa  Without masses or tenderness  Anus: Normal   Assessment/Plan:  50 y.o. female for annual exam   1. Well female exam with routine gynecological exam Normal gynecologic exam.  Pap test negative in 2020, will repeat next year.  Breast exam normal.  Screening mammogram scheduled for end of October 2022.  Colonoscopy 2021.  Health  labs with family physician.  Body mass index improving, now at 28.95.  Continue with fitness and lower calorie/carb diet.  2. Encounter for routine checking of intrauterine contraceptive device (IUD)  Well on Mirena IUD since January 2020.  IUD well-tolerated and in good position.  Princess Bruins MD, 9:14 AM 09/26/2020

## 2020-10-10 ENCOUNTER — Other Ambulatory Visit: Payer: Self-pay | Admitting: Internal Medicine

## 2020-10-10 DIAGNOSIS — Z1231 Encounter for screening mammogram for malignant neoplasm of breast: Secondary | ICD-10-CM

## 2020-10-23 ENCOUNTER — Encounter: Payer: 59 | Admitting: Emergency Medicine

## 2020-11-11 ENCOUNTER — Encounter: Payer: 59 | Admitting: Emergency Medicine

## 2020-11-14 ENCOUNTER — Ambulatory Visit
Admission: RE | Admit: 2020-11-14 | Discharge: 2020-11-14 | Disposition: A | Discharge status: Home / Self Care | Payer: 59 | Source: Ambulatory Visit | Attending: Internal Medicine | Admitting: Internal Medicine

## 2020-11-14 ENCOUNTER — Other Ambulatory Visit: Payer: Self-pay

## 2020-11-14 DIAGNOSIS — Z1231 Encounter for screening mammogram for malignant neoplasm of breast: Secondary | ICD-10-CM

## 2021-02-16 ENCOUNTER — Other Ambulatory Visit: Payer: Self-pay | Admitting: Internal Medicine

## 2021-02-16 DIAGNOSIS — E039 Hypothyroidism, unspecified: Secondary | ICD-10-CM

## 2021-10-02 ENCOUNTER — Other Ambulatory Visit (HOSPITAL_COMMUNITY)
Admission: RE | Admit: 2021-10-02 | Discharge: 2021-10-02 | Disposition: A | Discharge status: Home / Self Care | Payer: 59 | Source: Ambulatory Visit | Attending: Obstetrics & Gynecology | Admitting: Obstetrics & Gynecology

## 2021-10-02 ENCOUNTER — Ambulatory Visit (INDEPENDENT_AMBULATORY_CARE_PROVIDER_SITE_OTHER): Payer: 59 | Admitting: Obstetrics & Gynecology

## 2021-10-02 ENCOUNTER — Encounter: Payer: Self-pay | Admitting: Obstetrics & Gynecology

## 2021-10-02 VITALS — BP 114/72 | HR 70 | Ht 61.75 in | Wt 174.0 lb

## 2021-10-02 DIAGNOSIS — Z01419 Encounter for gynecological examination (general) (routine) without abnormal findings: Secondary | ICD-10-CM | POA: Diagnosis present

## 2021-10-02 DIAGNOSIS — E6609 Other obesity due to excess calories: Secondary | ICD-10-CM | POA: Diagnosis not present

## 2021-10-02 DIAGNOSIS — Z30431 Encounter for routine checking of intrauterine contraceptive device: Secondary | ICD-10-CM

## 2021-10-02 NOTE — Progress Notes (Signed)
Julia Wagner 08-19-1970 [id number]   History:    51 y.o. G2P2L2 Married.  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  Daughter graduated from [employer], currently at [employer] in [place].   RP:  Established patient presenting for annual gyn exam    HPI: Well on Mirena IUD x 01/2018.  No menses, no BTB.  No pelvic pain.  No pain with intercourse. Pap Neg 08/2018.  No h/o abnormal Pap.  Pap reflex today.  Urine and bowel movements normal.  Colono 11/2019. Breasts normal.  Mammo Neg 11/2020. Body mass index increased to 32.08. Increase physical activities and lower calories.  Health labs with FNP.   Past medical history,surgical history, family history and social history were all reviewed and documented in the EPIC chart.  Gynecologic History No LMP recorded. (Menstrual status: IUD).  Obstetric History OB History  Gravida Para Term Preterm AB Living  '2 2 2     2  '$ SAB IAB Ectopic Multiple Live Births               # Outcome Date GA Lbr Len/2nd Weight Sex Delivery Anes PTL Lv  2 Term           1 Term              ROS: A ROS was performed and pertinent positives and negatives are included in the history. GENERAL: No fevers or chills. HEENT: No change in vision, no earache, sore throat or sinus congestion. NECK: No pain or stiffness. CARDIOVASCULAR: No chest pain or pressure. No palpitations. PULMONARY: No shortness of breath, cough or wheeze. GASTROINTESTINAL: No abdominal pain, nausea, vomiting or diarrhea, melena or bright red blood per rectum. GENITOURINARY: No urinary frequency, urgency, hesitancy or dysuria. MUSCULOSKELETAL: No joint or muscle pain, no back pain, no recent trauma. DERMATOLOGIC: No rash, no itching, no lesions. ENDOCRINE: No polyuria, polydipsia, no heat or cold intolerance. No recent change in weight. HEMATOLOGICAL: No anemia or easy bruising or bleeding. NEUROLOGIC: No headache, seizures, numbness, tingling or weakness. PSYCHIATRIC: No depression, no loss of interest in normal  activity or change in sleep pattern.     Exam:   BP 114/72   Pulse 70   Ht 5' 1.75" (1.568 m)   Wt 174 lb (78.9 kg)   SpO2 99%   BMI 32.08 kg/m   Body mass index is 32.08 kg/m.  General appearance : Well developed well nourished female. No acute distress HEENT: Eyes: no retinal hemorrhage or exudates,  Neck supple, trachea midline, no carotid bruits, no thyroidmegaly Lungs: Clear to auscultation, no rhonchi or wheezes, or rib retractions  Heart: Regular rate and rhythm, no murmurs or gallops Breast:Examined in sitting and supine position were symmetrical in appearance, no palpable masses or tenderness,  no skin retraction, no nipple inversion, no nipple discharge, no skin discoloration, no axillary or supraclavicular lymphadenopathy Abdomen: no palpable masses or tenderness, no rebound or guarding Extremities: no edema or skin discoloration or tenderness  Pelvic: Vulva: Normal             Vagina: No gross lesions or discharge  Cervix: No gross lesions or discharge.  [facility]UD strings visible at [facility].  Pap reflex done.  Uterus  AV, normal size, shape and consistency, non-tender and mobile  Adnexa  Without masses or tenderness  Anus: Normal   Assessment/Plan:  51 y.o. female for annual exam   1. Encounter for routine gynecological examination with Papanicolaou smear of cervix Well on Mirena IUD x 01/2018.  No menses, no BTB.  No pelvic pain.  No pain with intercourse. Pap Neg 08/2018.  No h/o abnormal Pap.  Pap reflex today.  Urine and bowel movements normal.  Colono 11/2019. Breasts normal.  Mammo Neg 11/2020. Body mass index increased to 32.08. Increase physical activities and lower calories.  Health labs with FNP.  - Cytology - PAP( [place])  2. Encounter for routine checking of intrauterine contraceptive device (IUD) Well on Mirena IUD x 01/2018.  No menses, no BTB.  No pelvic pain.  No pain with intercourse. IUD in good position.    3. Class 1 obesity due to excess calories  without serious comorbidity with body mass index (BMI) of 32.0 to 32.9 in adult  Body mass index increased to 32.08. Increase physical activities and lower calories.   Princess Bruins MD, 9:09 AM 10/02/2021

## 2021-10-07 LAB — CYTOLOGY - PAP
Comment: NEGATIVE
Diagnosis: NEGATIVE
High risk HPV: NEGATIVE

## 2021-10-28 ENCOUNTER — Other Ambulatory Visit: Payer: Self-pay | Admitting: Obstetrics & Gynecology

## 2021-10-28 DIAGNOSIS — Z1231 Encounter for screening mammogram for malignant neoplasm of breast: Secondary | ICD-10-CM

## 2021-12-25 ENCOUNTER — Ambulatory Visit
Admission: RE | Admit: 2021-12-25 | Discharge: 2021-12-25 | Disposition: A | Discharge status: Home / Self Care | Payer: 59 | Source: Ambulatory Visit | Attending: Obstetrics & Gynecology | Admitting: Obstetrics & Gynecology

## 2021-12-25 DIAGNOSIS — Z1231 Encounter for screening mammogram for malignant neoplasm of breast: Secondary | ICD-10-CM

## 2022-10-08 ENCOUNTER — Other Ambulatory Visit: Payer: Self-pay | Admitting: Family Medicine

## 2022-10-08 ENCOUNTER — Encounter: Payer: Self-pay | Admitting: Obstetrics and Gynecology

## 2022-10-08 ENCOUNTER — Ambulatory Visit: Payer: 59 | Admitting: Obstetrics & Gynecology

## 2022-10-08 ENCOUNTER — Ambulatory Visit (INDEPENDENT_AMBULATORY_CARE_PROVIDER_SITE_OTHER): Payer: 59 | Admitting: Obstetrics and Gynecology

## 2022-10-08 VITALS — BP 110/80 | HR 81 | Ht 62.0 in | Wt 181.0 lb

## 2022-10-08 DIAGNOSIS — Z01419 Encounter for gynecological examination (general) (routine) without abnormal findings: Secondary | ICD-10-CM

## 2022-10-08 DIAGNOSIS — Z1231 Encounter for screening mammogram for malignant neoplasm of breast: Secondary | ICD-10-CM

## 2022-10-08 MED ORDER — ESTRADIOL 0.0375 MG/24HR TD PTWK: 0.0375 mg | MEDICATED_PATCH | TRANSDERMAL | 12 refills | Status: DC

## 2022-10-08 NOTE — Progress Notes (Signed)
52 y.o. y.o. female here for annual exam. She denies any vaginal bleeding.   No LMP recorded. (Menstrual status: IUD).    HPI: Well on Mirena IUD x 01/2018.  No menses, no BTB.  No pelvic pain.  No pain with intercourse. Pap Neg 08/2021.  No h/o abnormal Pap.  Pap reflex today.  Urine and bowel movements normal.  Colono 11/2019. Breasts normal.  Mammo Neg 12/2021. Body mass index is 33.11 kg/m.Marland Kitchen Increase physical activities and lower calories.  Health labs with FNP.  Does not have periods with the mirena IUD Bones: No family hx of osteoporosis or fractures, other risk factors: hypothyroidism   Blood pressure 110/80, pulse 81, height 5\' 2"  (1.575 m), weight 181 lb (82.1 kg), SpO2 97%.     Component Value Date/Time   DIAGPAP  10/02/2021 0939    - Negative for intraepithelial lesion or malignancy (NILM)   HPVHIGH Negative 10/02/2021 0939   ADEQPAP  10/02/2021 0939    Satisfactory for evaluation; transformation zone component PRESENT.    GYN HISTORY:    Component Value Date/Time   DIAGPAP  10/02/2021 0939    - Negative for intraepithelial lesion or malignancy (NILM)   HPVHIGH Negative 10/02/2021 0939   ADEQPAP  10/02/2021 0939    Satisfactory for evaluation; transformation zone component PRESENT.    OB History  Gravida Para Term Preterm AB Living  2 2 2     2   SAB IAB Ectopic Multiple Live Births               # Outcome Date GA Lbr Len/2nd Weight Sex Type Anes PTL Lv  2 Term           1 Term             Past Medical History:  Diagnosis Date   Allergy    seasonal   Anxiety    Chicken pox    Eczema    Fibroid, uterine    Hypothyroidism    IUD 09/04/2010   Mirena IUD   Kidney stones    UTI (urinary tract infection)     Past Surgical History:  Procedure Laterality Date   DILATATION & CURETTAGE/HYSTEROSCOPY WITH TRUECLEAR N/A 09/13/2012   Procedure: DILATATION & CURETTAGE/HYSTEROSCOPY TRUCLEAR RESECTOSCOPIC MYOMECTOMY, ;  Surgeon: Ok Edwards, MD;   Location: [facility];  Service: Gynecology;  Laterality: N/A;  TRUCLEAR RESECTOSCOPIC MYOMECTOMY, REMOVAL OF IUD    INTRAUTERINE DEVICE (IUD) INSERTION     mirena iud inserted 01-27-18   IUD REMOVAL N/A 09/13/2012   Procedure: INTRAUTERINE DEVICE (IUD) REMOVAL;  Surgeon: Ok Edwards, MD;  Location: [facility];  Service: Gynecology;  Laterality: N/A;   Mirena     Inserted 02-06-13   MYOMECTOMY     Removal of fibroma      Current Outpatient Medications on File Prior to Visit  Medication Sig Dispense Refill   Ascorbic Acid (VITAMIN C) 100 MG tablet Take 100 mg by mouth daily.     cholecalciferol (VITAMIN D3) 25 MCG (1000 UNIT) tablet Take 1,000 Units by mouth daily.     levothyroxine (SYNTHROID) 75 MCG tablet TAKE 1 TABLET (75 MCG TOTAL) BY MOUTH DAILY. APPT FURTHER REFILLS 90 tablet 1   loratadine (CLARITIN) 10 MG tablet Take 10 mg by mouth daily.     MAGNESIUM PO Take by mouth.     vitamin B-12 (CYANOCOBALAMIN) 1000 MCG tablet Take 1,000 mcg by mouth daily.     Current Facility-Administered Medications on  File Prior to Visit  Medication Dose Route Frequency Provider Last Rate Last Admin   levonorgestrel (MIRENA) 20 MCG/24HR IUD   Intrauterine Once Ok Edwards, MD        Social History   Socioeconomic History   Marital status: Married    Spouse name: Not on file   Number of children: 2   Years of education: Not on file   Highest education level: Associate degree: occupational, Scientist, product/process development, or vocational program  Occupational History   Occupation: Sports coach ASSIST    Employer: [employer]  Tobacco Use   Smoking status: Never   Smokeless tobacco: Never  Vaping Use   Vaping status: Never Used  Substance and Sexual Activity   Alcohol use: Not Currently    Comment: rare   Drug use: No   Sexual activity: Yes    Partners: Male    Birth control/protection: I.U.D.    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   Social Determinants of Health   Financial Resource Strain: Not on file  Food Insecurity: No Food Insecurity (07/17/2021)   Received from [facility], [facility]   Hunger Vital Sign    Worried About Running Out of Food in the Last Year: Never true    Ran Out of Food in the Last Year: Never true  Transportation Needs: Not on file  Physical Activity: Not on file  Stress: Not on file  Social Connections: Socially Integrated (12/08/2021)   Received from [facility], [facility]   Social Network    How would you rate your social network (family, work, friends)?: Good participation with social networks  Intimate Partner Violence: Not At Risk (12/08/2021)   Received from [employer] [employer], [facility]   HITS    Over the last 12 months how often did your partner physically hurt you?: 1    Over the last 12 months how often did your partner insult you or talk down to you?: 1    Over the last 12 months how often did your partner threaten you with physical harm?: 1    Over the last 12 months how often did your partner scream or curse at you?: 1    Family History  Problem Relation Age of Onset   Hypertension Mother    Colon polyps Mother    Arthritis Mother    Hyperlipidemia Mother    Dementia Mother    Diabetes Father    Hypertension Father    Heart disease Father    Kidney disease Father    Arthritis Father    Hyperlipidemia Father    Stroke Father    Diabetes Maternal Grandmother    Heart disease Paternal Grandmother    Heart disease Paternal Grandfather      No Known Allergies    Patient's last menstrual period was No LMP recorded. (Menstrual status: IUD)..            Review of Systems Alls systems reviewed and are negative.     Physical Exam Constitutional:      Appearance: Normal appearance.  Genitourinary:     Vulva and urethral meatus normal.     No lesions  in the vagina.     Right Labia: No rash, lesions or skin changes.    Left Labia: No  lesions, skin changes or rash.    No vaginal discharge or tenderness.     No vaginal prolapse present.    No vaginal atrophy present.     Right Adnexa: not tender, not palpable and no mass present.    Left Adnexa: not tender, not palpable and no mass present.    No cervical motion tenderness or discharge.     IUD strings visualized.     Uterus is not enlarged, tender or irregular.     Uterus is anteverted.  Breasts:    Right: Normal.     Left: Normal.  HENT:     Head: Normocephalic.  Neck:     Thyroid: No thyroid mass, thyromegaly or thyroid tenderness.  Cardiovascular:     Rate and Rhythm: Normal rate and regular rhythm.     Heart sounds: Normal heart sounds, S1 normal and S2 normal.  Pulmonary:     Effort: Pulmonary effort is normal.     Breath sounds: Normal breath sounds and air entry.  Abdominal:     General: There is no distension.     Palpations: Abdomen is soft. There is no mass.     Tenderness: There is no abdominal tenderness. There is no guarding or rebound.  Musculoskeletal:        General: Normal range of motion.     Cervical back: Full passive range of motion without pain, normal range of motion and neck supple. No tenderness.     Right lower leg: No edema.     Left lower leg: No edema.  Neurological:     Mental Status: She is alert.  Skin:    General: Skin is warm.  Psychiatric:        Mood and Affect: Mood normal.        Behavior: Behavior normal.        Thought Content: Thought content normal.  Vitals and nursing note reviewed. Exam conducted with a chaperone present.       A:         Well Woman GYN exam, mirena IUD since 2020 amenorrhea                             P:        Pap smear collected today  Can decrease testing to Q3-5 years since all normal and low risk Encouraged annual mammogram screening Colon cancer screening up-to-date DXA not indicated Labs and  immunizations to do with PMD Discussed breast self exams Encouraged healthy lifestyle practices Encouraged Vit D and Calcium  Counseled on the r/b/a/I of HRT use. To get a hormone panel.  Discussed that she will need progesterone to avoid unopposed estrogen on the endometrial lining and risk for endometrial cancer. Discussed lower risk for DVT and stroke with the patch. Side effects include risk of breast tenderness and spotting along with low risk of blood clots and stroke with uncontrolled hypertension. Counseled on the benefits to help improve the bone density during menopause.  No follow-ups on file.  Earley Favor

## 2022-10-09 LAB — FOLLICLE STIMULATING HORMONE: FSH: 125 m[IU]/mL — ABNORMAL HIGH

## 2022-10-09 LAB — ESTRADIOL: Estradiol: <15 pg/mL

## 2022-10-11 ENCOUNTER — Other Ambulatory Visit (HOSPITAL_COMMUNITY)
Admission: RE | Admit: 2022-10-11 | Discharge: 2022-10-11 | Disposition: A | Discharge status: Home / Self Care | Payer: 59 | Source: Ambulatory Visit | Attending: Obstetrics and Gynecology | Admitting: Obstetrics and Gynecology

## 2022-10-11 DIAGNOSIS — Z01419 Encounter for gynecological examination (general) (routine) without abnormal findings: Secondary | ICD-10-CM | POA: Insufficient documentation

## 2022-10-11 NOTE — Addendum Note (Signed)
Addended by: Earley Favor on: 10/11/2022 08:22 AM   Modules accepted: Orders

## 2022-10-13 LAB — CYTOLOGY - PAP
Comment: NEGATIVE
Diagnosis: NEGATIVE
High risk HPV: NEGATIVE

## 2022-12-31 ENCOUNTER — Ambulatory Visit
Admission: RE | Admit: 2022-12-31 | Discharge: 2022-12-31 | Disposition: A | Discharge status: Home / Self Care | Payer: 59 | Source: Ambulatory Visit | Attending: Family Medicine | Admitting: Family Medicine

## 2022-12-31 DIAGNOSIS — Z1231 Encounter for screening mammogram for malignant neoplasm of breast: Secondary | ICD-10-CM

## 2022-12-31 DIAGNOSIS — Z01419 Encounter for gynecological examination (general) (routine) without abnormal findings: Secondary | ICD-10-CM

## 2023-02-08 ENCOUNTER — Telehealth: Payer: Self-pay

## 2023-02-08 NOTE — Telephone Encounter (Signed)
 Pt LVM in triage line stating she needs her rx for HRT patches [facility] due to insurance.   Med refill request: Patch rx transfer Last AEX: 10/08/2022-EB Next AEX: recall placed for 10/2023 Last MMG (if hormonal med): 12/31/2022-WNL Refill authorized: rx pend.

## 2023-02-09 MED ORDER — ESTRADIOL 0.0375 MG/24HR TD PTWK: 0.0375 mg | MEDICATED_PATCH | TRANSDERMAL | 2 refills | Status: AC

## 2023-02-09 NOTE — Telephone Encounter (Signed)
 Per EB: "That is fine. Do you mind sending.  Please make sure if she has a uterus that she will need progesterone. Thank you Dr. Tia Flowers"  Rx sent. Pt notified. FYI. Pt currently has a mirena  IUD for uterine protection. Thanks. Encounter closed.

## 2023-08-09 IMAGING — MG MM DIGITAL SCREENING BILAT W/ TOMO AND CAD
8 series · 9 of 24 positions shown · non-contrast
Comparison: Previous exam(s).

CLINICAL DATA: Screening.

EXAM:
DIGITAL SCREENING BILATERAL MAMMOGRAM WITH TOMOSYNTHESIS AND CAD
TECHNIQUE: Bilateral screening digital craniocaudal and mediolateral oblique
mammograms were obtained. Bilateral screening digital breast
tomosynthesis was performed. The images were evaluated with
computer-aided detection.

[L MLO synth-2D]
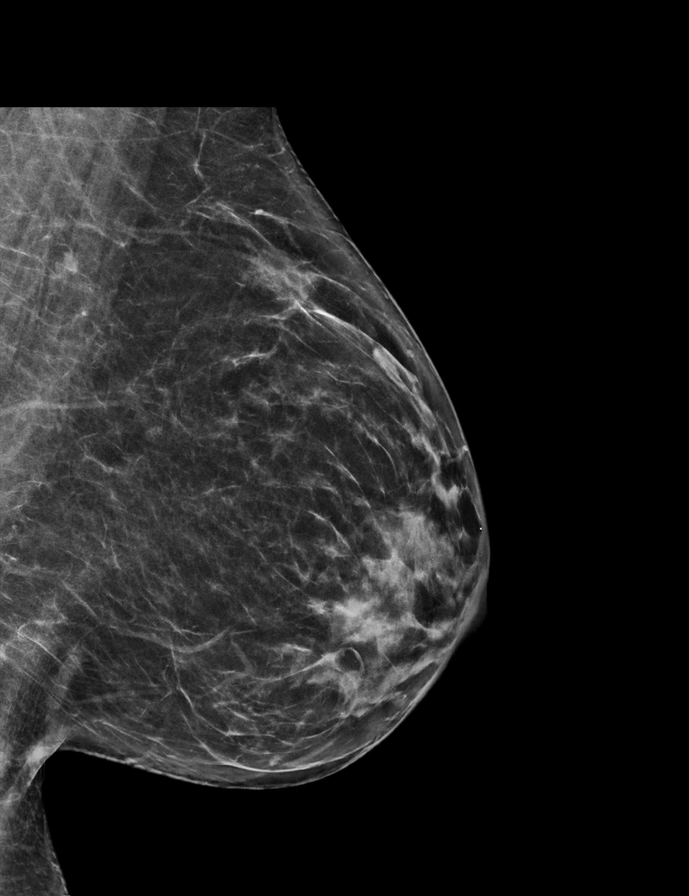

[L CC synth-2D]
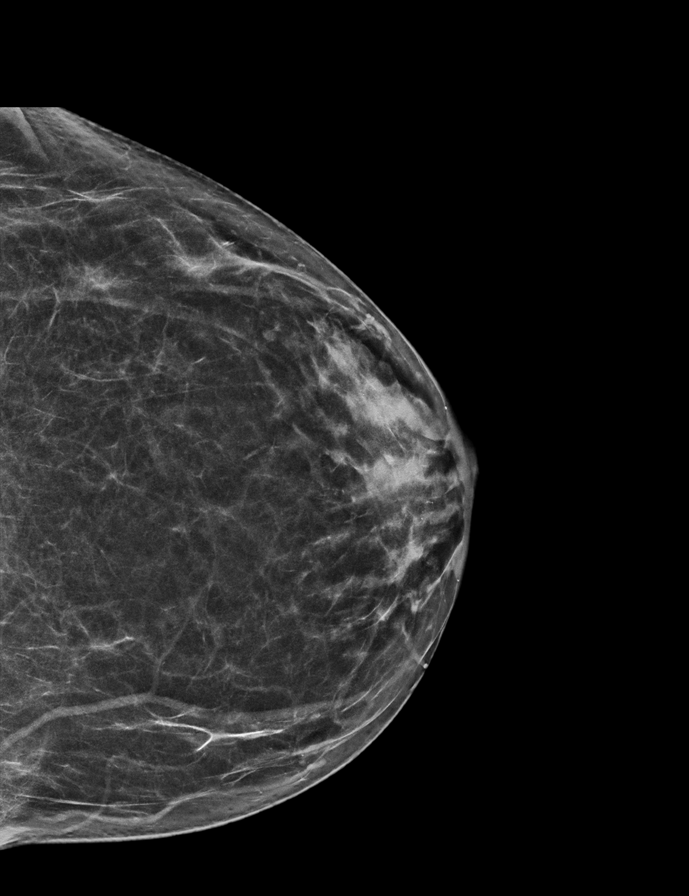

[R CC synth-2D]
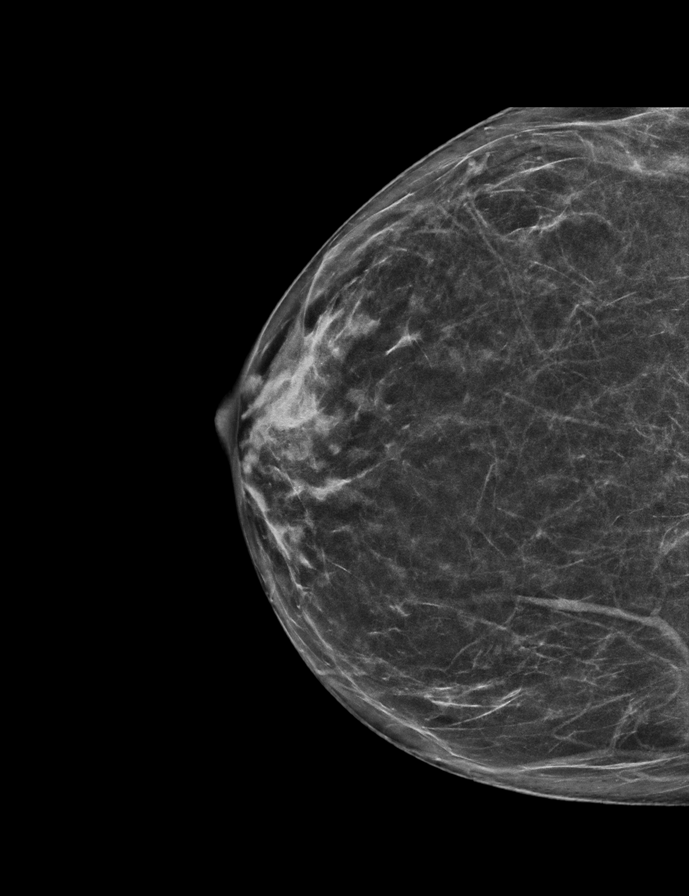

[R MLO synth-2D]
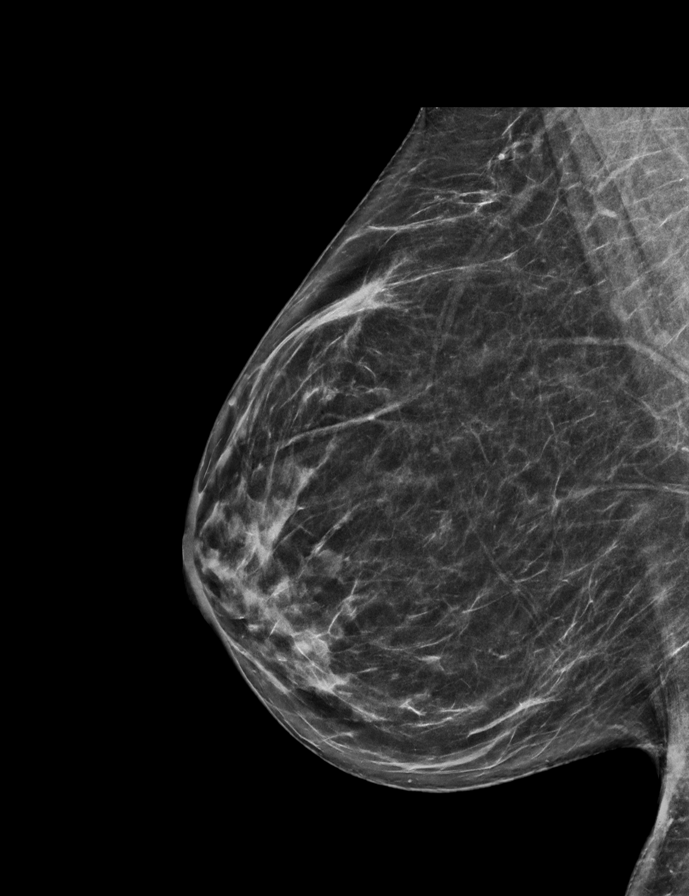

[L CC tomo · 2 of 57 frames shown]
[frame 19/57]
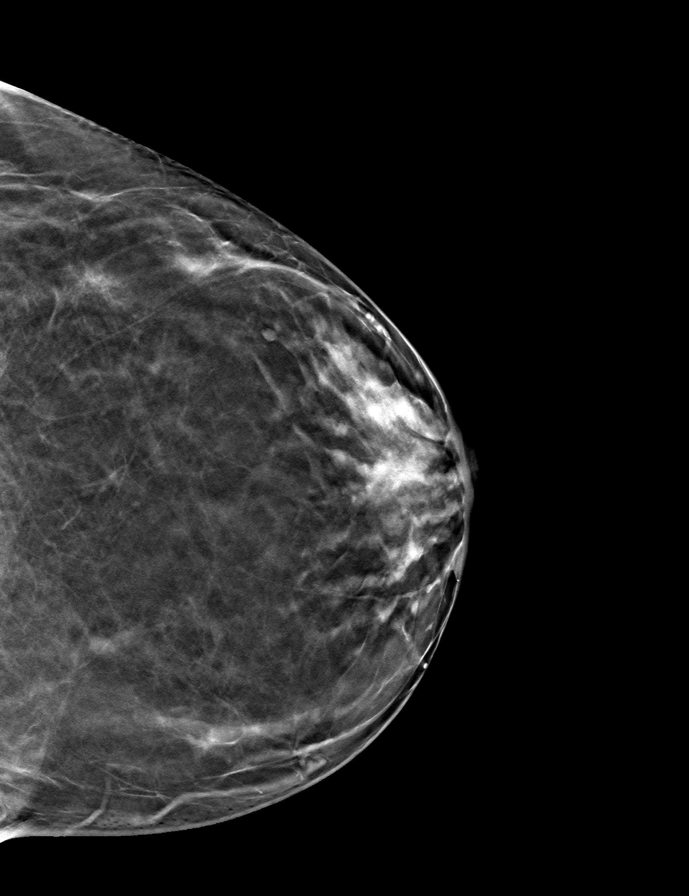
[frame 29/57]
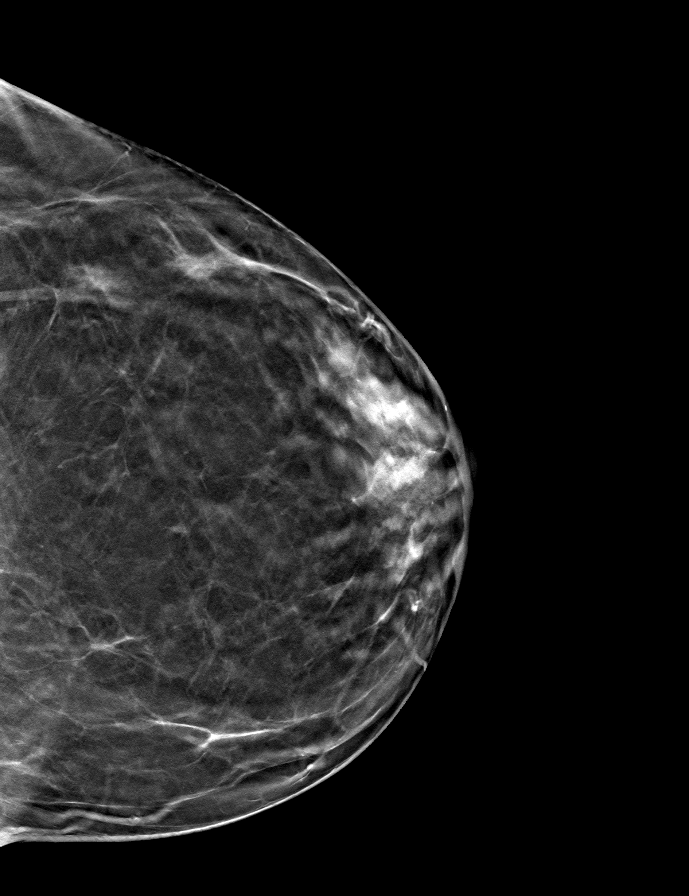

[L MLO tomo · tomo slice 33/66.0]
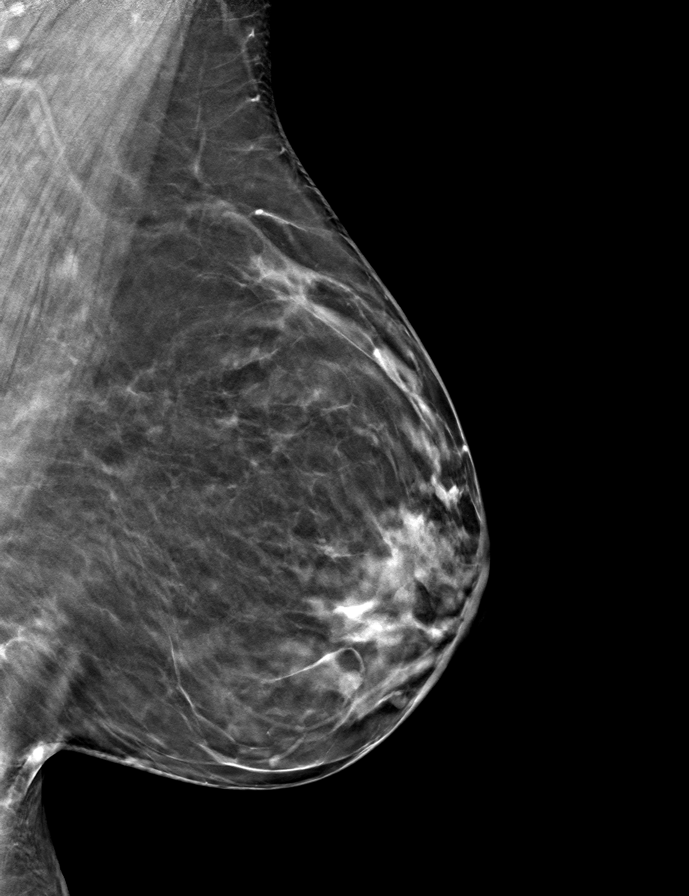

[R CC tomo · tomo slice 29/57.0]
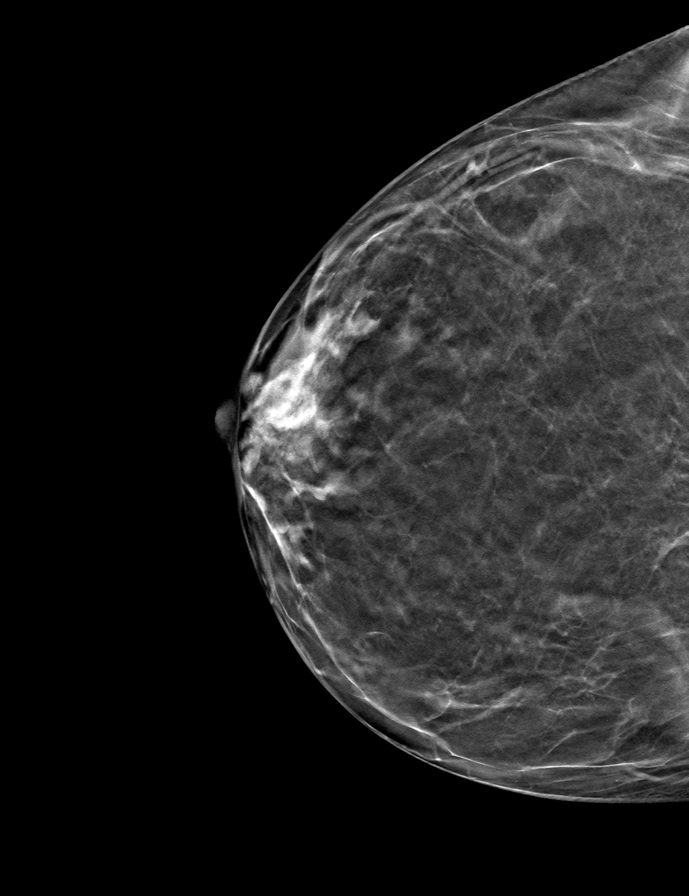

[R MLO tomo · tomo slice 33/64.0]
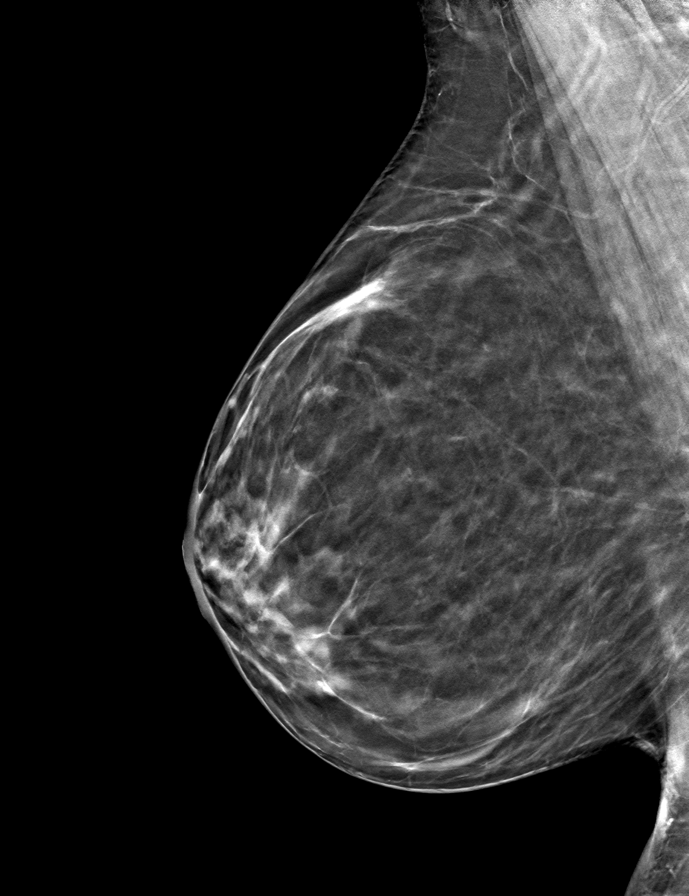

[9 of 24 positions shown; findings below may reference images not displayed]

ACR Breast Density Category b: There are scattered areas of
fibroglandular density.
FINDINGS: There are no findings suspicious for malignancy.
IMPRESSION: No mammographic evidence of malignancy. A result letter of this
screening mammogram will be mailed directly to the patient.

RECOMMENDATION:
Screening mammogram in one year. (Code:51-O-LD2)

BI-RADS CATEGORY  1: Negative.

## 2023-12-30 ENCOUNTER — Other Ambulatory Visit: Payer: Self-pay | Admitting: Family Medicine

## 2023-12-30 ENCOUNTER — Ambulatory Visit (INDEPENDENT_AMBULATORY_CARE_PROVIDER_SITE_OTHER): Admitting: Obstetrics and Gynecology

## 2023-12-30 ENCOUNTER — Encounter: Payer: Self-pay | Admitting: Obstetrics and Gynecology

## 2023-12-30 VITALS — BP 122/84 | HR 87 | Ht 63.0 in | Wt 185.0 lb

## 2023-12-30 DIAGNOSIS — Z1231 Encounter for screening mammogram for malignant neoplasm of breast: Secondary | ICD-10-CM

## 2023-12-30 DIAGNOSIS — Z1331 Encounter for screening for depression: Secondary | ICD-10-CM

## 2023-12-30 DIAGNOSIS — D229 Melanocytic nevi, unspecified: Secondary | ICD-10-CM

## 2023-12-30 DIAGNOSIS — Z01419 Encounter for gynecological examination (general) (routine) without abnormal findings: Secondary | ICD-10-CM

## 2023-12-30 NOTE — Progress Notes (Signed)
 "   53 y.o. y.o. female here for annual exam. No LMP recorded. (Menstrual status: IUD).     HPI: Well on Mirena  IUD x 01/2018.  No menses, no BTB.  No pelvic pain.  No pain with intercourse. Pap Neg 08/2022.  No h/o abnormal Pap.  Pap reflex today.  Urine and bowel movements normal.  Colono 11/2019. Breasts normal.  Mammo Neg 12/24 to schedule today. Body mass index is 33.11 kg/m at last visit. Increase physical activities and lower calories.  Health labs with FNP.  Does not have periods with the mirena  IUD Bones: No family hx of osteoporosis or fractures, other risk factors: hypothyroidism  Body mass index is 32.77 kg/m.     12/30/2023    8:06 AM 08/03/2019    9:55 AM 09/08/2018    8:50 AM  Depression screen PHQ 2/9  Decreased Interest 0 1 0  Down, Depressed, Hopeless 0 0 0  PHQ - 2 Score 0 1 0  Altered sleeping  1 0  Tired, decreased energy  1 0  Change in appetite  1 0  Feeling bad or failure about yourself   1 0  Trouble concentrating  1 0  Moving slowly or fidgety/restless  0 0  Suicidal thoughts  0 0  PHQ-9 Score  6  0   Difficult doing work/chores   Not difficult at all     Data saved with a previous flowsheet row definition    Blood pressure 122/84, pulse 87, height 5' 3 (1.6 m), weight 185 lb (83.9 kg), SpO2 99%.     Component Value Date/Time   DIAGPAP  10/11/2022 0822    - Negative for intraepithelial lesion or malignancy (NILM)   DIAGPAP  10/02/2021 0939    - Negative for intraepithelial lesion or malignancy (NILM)   HPVHIGH Negative 10/11/2022 0822   HPVHIGH Negative 10/02/2021 0939   ADEQPAP  10/11/2022 0822    Satisfactory for evaluation; transformation zone component PRESENT.   ADEQPAP  10/02/2021 0939    Satisfactory for evaluation; transformation zone component PRESENT.    GYN HISTORY:    Component Value Date/Time   DIAGPAP  10/11/2022 0822    - Negative for intraepithelial lesion or malignancy (NILM)   DIAGPAP  10/02/2021 0939    - Negative  for intraepithelial lesion or malignancy (NILM)   HPVHIGH Negative 10/11/2022 0822   HPVHIGH Negative 10/02/2021 0939   ADEQPAP  10/11/2022 0822    Satisfactory for evaluation; transformation zone component PRESENT.   ADEQPAP  10/02/2021 0939    Satisfactory for evaluation; transformation zone component PRESENT.    OB History  Gravida Para Term Preterm AB Living  2 2 2   2   SAB IAB Ectopic Multiple Live Births          # Outcome Date GA Lbr Len/2nd Weight Sex Type Anes PTL Lv  2 Term           1 Term             Past Medical History:  Diagnosis Date   Allergy    seasonal   Anxiety    Chicken pox    Eczema    Fibroid, uterine    Hypothyroidism    IUD 09/04/2010   Mirena  IUD   Kidney stones    UTI (urinary tract infection)     Past Surgical History:  Procedure Laterality Date   DILATATION & CURETTAGE/HYSTEROSCOPY WITH TRUECLEAR N/A 09/13/2012   Procedure: DILATATION & CURETTAGE/HYSTEROSCOPY TRUCLEAR RESECTOSCOPIC  MYOMECTOMY, ;  Surgeon: Curlee VEAR Guan, MD;  Location: [facility];  Service: Gynecology;  Laterality: N/A;  TRUCLEAR RESECTOSCOPIC MYOMECTOMY, REMOVAL OF IUD    INTRAUTERINE DEVICE (IUD) INSERTION     mirena  iud inserted 01-27-18   IUD REMOVAL N/A 09/13/2012   Procedure: INTRAUTERINE DEVICE (IUD) REMOVAL;  Surgeon: Curlee VEAR Guan, MD;  Location: [facility];  Service: Gynecology;  Laterality: N/A;   Mirena      Inserted 02-06-13   MYOMECTOMY     Removal of fibroma      Medications Ordered Prior to Encounter[1]  Social History   Socioeconomic History   Marital status: Married    Spouse name: Not on file   Number of children: 2   Years of education: Not on file   Highest education level: Associate degree: occupational, scientist, product/process development, or vocational program  Occupational History   Occupation: SPORTS COACH ASSIST    Employer: [employer]  Tobacco Use   Smoking status: Never   Smokeless tobacco: Never  Vaping Use   Vaping status: Never Used  Substance and  Sexual Activity   Alcohol use: Not Currently    Comment: rare   Drug use: No   Sexual activity: Yes    Partners: Male    Birth control/protection: I.U.D.    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   Social Drivers of Health   Tobacco Use: Low Risk (12/30/2023)   Patient History    Smoking Tobacco Use: Never    Smokeless Tobacco Use: Never    Passive Exposure: Not on file  Financial Resource Strain: Patient Declined (12/22/2023)   Received from [facility]   Overall Financial Resource Strain (CARDIA)    How hard is it for you to pay for the very basics like food, housing, medical care, and heating?: Patient declined  Food Insecurity: Patient Declined (12/22/2023)   Received from [facility]   Epic    Within the past 12 months, you worried that your food would run out before you got the money to buy more.: Patient declined    Within the past 12 months, the food you bought just didn't last and you didn't have money to get more.: Patient declined  Transportation Needs: Patient Declined (12/22/2023)   Received from [facility]    In the past 12 months, has lack of transportation kept you from medical appointments or from getting medications?: Patient declined    In the past 12 months, has lack of transportation kept you from meetings, work, or from getting things needed for daily living?: Patient declined  Physical Activity: Unknown (12/22/2023)   Received from [facility]   Exercise Vital Sign    On average, how many days per week do you engage in moderate to strenuous exercise (like a brisk walk)?: Patient declined    Minutes of Exercise per Session: Not on file  Stress: Patient Declined (12/22/2023)   Received from [facility] - Occupational Stress Questionnaire     Do you feel stress - tense, restless, nervous, or anxious, or unable to sleep at night because your mind is troubled all the time - these days?: Patient declined  Social Connections: Patient Declined (12/22/2023)   Received from [facility]   Social Network  How would you rate your social network (family, work, friends)?: Patient declined  Intimate Partner Violence: Patient Declined (12/22/2023)   Received from [facility]   HITS    Over the last 12 months how often did your partner physically hurt you?: Patient declined    Over the last 12 months how often did your partner insult you or talk down to you?: Patient declined    Over the last 12 months how often did your partner threaten you with physical harm?: Patient declined    Over the last 12 months how often did your partner scream or curse at you?: Patient declined  Depression (PHQ2-9): Low Risk (12/30/2023)   Depression (PHQ2-9)    PHQ-2 Score: 0  Alcohol Screen: Not on file  Housing: Unknown (12/23/2023)   Received from [facility]   Epic    Unable to Pay for Housing in the Last Year: Not on file    In the past 12 months, how many times have you moved where you were living?: 0    Homeless in the Last Year: Not on file  Utilities: Patient Declined (12/22/2023)   Received from [facility]    In the past 12 months has the electric, gas, oil, or water company threatened to shut off services in your home?: Patient declined  Health Literacy: Not on file    Family History  Problem Relation Age of Onset   Hypertension Mother    Colon polyps Mother    Arthritis Mother    Hyperlipidemia Mother    Dementia Mother    Diabetes Father    Hypertension Father    Heart disease Father    Kidney disease Father    Arthritis Father    Hyperlipidemia Father    Stroke Father    Diabetes Maternal Grandmother    Heart disease Paternal Grandmother    Heart disease Paternal Grandfather       Allergies[2]    Patient's last menstrual period was No LMP recorded. (Menstrual status: IUD)..           Review of Systems Alls systems reviewed and are negative.     Physical Exam Constitutional:      Appearance: Normal appearance.  Genitourinary:     Vulva and urethral meatus normal.     No lesions in the vagina.     Right Labia: No rash, lesions or skin changes.    Left Labia: No lesions, skin changes or rash.    No vaginal discharge or tenderness.     No vaginal prolapse present.    No vaginal atrophy present.     Right Adnexa: not tender, not palpable and no mass present.    Left Adnexa: not tender, not palpable and no mass present.    No cervical motion tenderness or discharge.     IUD strings visualized.     Uterus is not enlarged, tender or irregular.  Breasts:    Right: Normal.     Left: Normal.  HENT:     Head: Normocephalic.  Neck:     Thyroid : No thyroid  mass, thyromegaly or thyroid  tenderness.  Cardiovascular:     Rate and Rhythm: Normal rate and regular rhythm.     Heart sounds: Normal heart sounds, S1 normal and S2 normal.  Pulmonary:     Effort: Pulmonary effort is normal.     Breath sounds: Normal breath sounds and air entry.  Abdominal:     General: There is no distension.  Palpations: Abdomen is soft. There is no mass.     Tenderness: There is no abdominal tenderness. There is no guarding or rebound.  Musculoskeletal:        General: Normal range of motion.     Cervical back: Full passive range of motion without pain, normal range of motion and neck supple. No tenderness.     Right lower leg: No edema.     Left lower leg: No edema.  Neurological:     Mental Status: She is alert.  Skin:    General: Skin is warm.  Psychiatric:        Mood and Affect: Mood normal.        Behavior: Behavior normal.        Thought Content: Thought content normal.  Vitals and nursing note reviewed. Exam conducted with a chaperone present.       A:          Well Woman GYN exam                             P:        Pap smear not indicated Encouraged annual mammogram screening Colon cancer screening up-to-date DXA not indicated Labs and immunizations to do with PMD Discussed breast self exams Encouraged healthy lifestyle practices Encouraged Vit D and Calcium   No follow-ups on file.  Julia Wagner    [1]  Current Outpatient Medications on File Prior to Visit  Medication Sig Dispense Refill   Ascorbic Acid (VITAMIN C) 100 MG tablet Take 100 mg by mouth daily.     cholecalciferol (VITAMIN D3) 25 MCG (1000 UNIT) tablet Take 1,000 Units by mouth daily.     estradiol  (CLIMARA ) 0.0375 mg/24hr patch Place 1 patch (0.0375 mg total) onto the skin once a week. 24 patch 2   levothyroxine  (SYNTHROID ) 75 MCG tablet TAKE 1 TABLET (75 MCG TOTAL) BY MOUTH DAILY. APPT FURTHER REFILLS 90 tablet 1   loratadine (CLARITIN) 10 MG tablet Take 10 mg by mouth daily.     MAGNESIUM PO Take by mouth.     vitamin B-12 (CYANOCOBALAMIN) 1000 MCG tablet Take 1,000 mcg by mouth daily.     Current Facility-Administered Medications on File Prior to Visit  Medication Dose Route Frequency Provider Last Rate Last Admin   levonorgestrel  (MIRENA ) 20 MCG/24HR IUD   Intrauterine Once Winfred Curlee DEL, MD      [2] No Known Allergies  "

## 2024-01-20 ENCOUNTER — Ambulatory Visit
Admission: RE | Admit: 2024-01-20 | Discharge: 2024-01-20 | Disposition: A | Discharge status: Home / Self Care | Source: Ambulatory Visit | Attending: Family Medicine | Admitting: Family Medicine

## 2024-01-20 DIAGNOSIS — Z1231 Encounter for screening mammogram for malignant neoplasm of breast: Secondary | ICD-10-CM

## 2025-01-18 ENCOUNTER — Ambulatory Visit: Admitting: Obstetrics and Gynecology
